# Patient Record
Sex: Female | Born: 1975 | Race: Black or African American | Hispanic: No | Smoking: Never smoker
Health system: Southern US, Community
[De-identification: ages and names within clinical notes are randomized; demographics above are authoritative.]

## PROBLEM LIST (undated history)

## (undated) DIAGNOSIS — I10 Essential (primary) hypertension: Secondary | ICD-10-CM

## (undated) DIAGNOSIS — R0902 Hypoxemia: Secondary | ICD-10-CM

## (undated) DIAGNOSIS — R112 Nausea with vomiting, unspecified: Secondary | ICD-10-CM

## (undated) DIAGNOSIS — G4730 Sleep apnea, unspecified: Secondary | ICD-10-CM

## (undated) DIAGNOSIS — E119 Type 2 diabetes mellitus without complications: Secondary | ICD-10-CM

## (undated) DIAGNOSIS — R011 Cardiac murmur, unspecified: Secondary | ICD-10-CM

## (undated) DIAGNOSIS — Z9889 Other specified postprocedural states: Secondary | ICD-10-CM

## (undated) DIAGNOSIS — K219 Gastro-esophageal reflux disease without esophagitis: Secondary | ICD-10-CM

## (undated) DIAGNOSIS — I214 Non-ST elevation (NSTEMI) myocardial infarction: Secondary | ICD-10-CM

## (undated) DIAGNOSIS — E785 Hyperlipidemia, unspecified: Secondary | ICD-10-CM

## (undated) DIAGNOSIS — G4733 Obstructive sleep apnea (adult) (pediatric): Secondary | ICD-10-CM

## (undated) HISTORY — DX: Hyperlipidemia, unspecified: E78.5

## (undated) HISTORY — DX: Nausea with vomiting, unspecified: R11.2

## (undated) HISTORY — DX: Sleep apnea, unspecified: G47.30

## (undated) HISTORY — DX: Non-ST elevation (NSTEMI) myocardial infarction: I21.4

## (undated) HISTORY — DX: Hypoxemia: R09.02

## (undated) HISTORY — DX: Other specified postprocedural states: Z98.890

## (undated) HISTORY — DX: Cardiac murmur, unspecified: R01.1

## (undated) HISTORY — DX: Type 2 diabetes mellitus without complications: E11.9

## (undated) HISTORY — PX: CHOLECYSTECTOMY: SHX55

## (undated) HISTORY — DX: Gastro-esophageal reflux disease without esophagitis: K21.9

## (undated) HISTORY — PX: SLEEVE GASTROPLASTY: SHX1101

---

## 1996-12-24 HISTORY — PX: LAPAROSCOPY: SHX197

## 1998-02-11 ENCOUNTER — Inpatient Hospital Stay (HOSPITAL_COMMUNITY): Admission: AD | Admit: 1998-02-11 | Discharge: 1998-02-11 | Payer: Self-pay | Admitting: Obstetrics

## 1998-02-13 ENCOUNTER — Inpatient Hospital Stay (HOSPITAL_COMMUNITY): Admission: AD | Admit: 1998-02-13 | Discharge: 1998-02-13 | Payer: Self-pay | Admitting: Obstetrics

## 1998-03-11 ENCOUNTER — Ambulatory Visit (HOSPITAL_COMMUNITY): Admission: RE | Admit: 1998-03-11 | Discharge: 1998-03-11 | Payer: Self-pay | Admitting: Obstetrics

## 1998-03-18 ENCOUNTER — Inpatient Hospital Stay (HOSPITAL_COMMUNITY): Admission: AD | Admit: 1998-03-18 | Discharge: 1998-03-18 | Payer: Self-pay | Admitting: Obstetrics

## 1998-03-22 ENCOUNTER — Inpatient Hospital Stay (HOSPITAL_COMMUNITY): Admission: AD | Admit: 1998-03-22 | Discharge: 1998-03-22 | Payer: Self-pay | Admitting: Obstetrics

## 1998-05-06 ENCOUNTER — Inpatient Hospital Stay (HOSPITAL_COMMUNITY): Admission: AD | Admit: 1998-05-06 | Discharge: 1998-05-06 | Payer: Self-pay | Admitting: Obstetrics and Gynecology

## 1998-06-23 ENCOUNTER — Inpatient Hospital Stay (HOSPITAL_COMMUNITY): Admission: AD | Admit: 1998-06-23 | Discharge: 1998-06-23 | Payer: Self-pay | Admitting: Obstetrics and Gynecology

## 1998-07-01 ENCOUNTER — Inpatient Hospital Stay (HOSPITAL_COMMUNITY): Admission: AD | Admit: 1998-07-01 | Discharge: 1998-07-05 | Payer: Self-pay | Admitting: Obstetrics and Gynecology

## 1998-07-06 ENCOUNTER — Encounter (HOSPITAL_COMMUNITY): Admission: RE | Admit: 1998-07-06 | Discharge: 1998-10-04 | Payer: Self-pay | Admitting: Obstetrics and Gynecology

## 1998-10-20 ENCOUNTER — Encounter (HOSPITAL_COMMUNITY): Admission: RE | Admit: 1998-10-20 | Discharge: 1999-01-18 | Payer: Self-pay | Admitting: *Deleted

## 1999-02-07 ENCOUNTER — Other Ambulatory Visit: Admission: RE | Admit: 1999-02-07 | Discharge: 1999-02-07 | Payer: Self-pay | Admitting: Obstetrics and Gynecology

## 1999-07-15 ENCOUNTER — Emergency Department (HOSPITAL_COMMUNITY): Admission: EM | Admit: 1999-07-15 | Discharge: 1999-07-15 | Payer: Self-pay | Admitting: Emergency Medicine

## 1999-07-15 ENCOUNTER — Encounter: Payer: Self-pay | Admitting: Emergency Medicine

## 2000-01-30 ENCOUNTER — Other Ambulatory Visit: Admission: RE | Admit: 2000-01-30 | Discharge: 2000-01-30 | Payer: Self-pay | Admitting: Obstetrics and Gynecology

## 2000-02-08 ENCOUNTER — Ambulatory Visit (HOSPITAL_COMMUNITY): Admission: RE | Admit: 2000-02-08 | Discharge: 2000-02-08 | Payer: Self-pay | Admitting: Obstetrics and Gynecology

## 2000-02-08 ENCOUNTER — Encounter: Payer: Self-pay | Admitting: Obstetrics and Gynecology

## 2000-03-22 ENCOUNTER — Ambulatory Visit (HOSPITAL_COMMUNITY): Admission: RE | Admit: 2000-03-22 | Discharge: 2000-03-22 | Payer: Self-pay | Admitting: Obstetrics and Gynecology

## 2000-03-22 ENCOUNTER — Encounter: Payer: Self-pay | Admitting: Obstetrics and Gynecology

## 2000-05-14 ENCOUNTER — Inpatient Hospital Stay (HOSPITAL_COMMUNITY): Admission: AD | Admit: 2000-05-14 | Discharge: 2000-05-14 | Payer: Self-pay | Admitting: Obstetrics and Gynecology

## 2000-06-07 ENCOUNTER — Ambulatory Visit (HOSPITAL_COMMUNITY): Admission: RE | Admit: 2000-06-07 | Discharge: 2000-06-07 | Payer: Self-pay | Admitting: Obstetrics and Gynecology

## 2000-08-08 ENCOUNTER — Inpatient Hospital Stay (HOSPITAL_COMMUNITY): Admission: AD | Admit: 2000-08-08 | Discharge: 2000-08-08 | Payer: Self-pay | Admitting: Obstetrics and Gynecology

## 2000-08-17 ENCOUNTER — Inpatient Hospital Stay (HOSPITAL_COMMUNITY): Admission: AD | Admit: 2000-08-17 | Discharge: 2000-08-20 | Payer: Self-pay | Admitting: Obstetrics and Gynecology

## 2000-11-28 ENCOUNTER — Encounter: Payer: Self-pay | Admitting: Emergency Medicine

## 2000-11-28 ENCOUNTER — Emergency Department (HOSPITAL_COMMUNITY): Admission: EM | Admit: 2000-11-28 | Discharge: 2000-11-28 | Payer: Self-pay | Admitting: Emergency Medicine

## 2001-01-28 ENCOUNTER — Other Ambulatory Visit: Admission: RE | Admit: 2001-01-28 | Discharge: 2001-01-28 | Payer: Self-pay | Admitting: Obstetrics and Gynecology

## 2001-05-23 ENCOUNTER — Emergency Department (HOSPITAL_COMMUNITY): Admission: EM | Admit: 2001-05-23 | Discharge: 2001-05-23 | Payer: Self-pay | Admitting: Emergency Medicine

## 2001-11-26 ENCOUNTER — Encounter: Admission: RE | Admit: 2001-11-26 | Discharge: 2001-11-26 | Payer: Self-pay | Admitting: Family Medicine

## 2001-11-26 ENCOUNTER — Encounter: Payer: Self-pay | Admitting: Family Medicine

## 2001-12-15 ENCOUNTER — Encounter: Payer: Self-pay | Admitting: General Surgery

## 2001-12-15 ENCOUNTER — Encounter (INDEPENDENT_AMBULATORY_CARE_PROVIDER_SITE_OTHER): Payer: Self-pay | Admitting: Specialist

## 2001-12-15 ENCOUNTER — Observation Stay (HOSPITAL_COMMUNITY): Admission: RE | Admit: 2001-12-15 | Discharge: 2001-12-16 | Payer: Self-pay | Admitting: General Surgery

## 2002-02-24 ENCOUNTER — Other Ambulatory Visit: Admission: RE | Admit: 2002-02-24 | Discharge: 2002-02-24 | Payer: Self-pay | Admitting: Obstetrics and Gynecology

## 2003-01-11 ENCOUNTER — Emergency Department (HOSPITAL_COMMUNITY): Admission: EM | Admit: 2003-01-11 | Discharge: 2003-01-11 | Payer: Self-pay | Admitting: Emergency Medicine

## 2003-02-05 ENCOUNTER — Ambulatory Visit (HOSPITAL_COMMUNITY): Admission: RE | Admit: 2003-02-05 | Discharge: 2003-02-05 | Payer: Self-pay | Admitting: Obstetrics and Gynecology

## 2003-02-05 ENCOUNTER — Encounter: Payer: Self-pay | Admitting: Obstetrics and Gynecology

## 2003-04-14 ENCOUNTER — Inpatient Hospital Stay (HOSPITAL_COMMUNITY): Admission: AD | Admit: 2003-04-14 | Discharge: 2003-04-14 | Payer: Self-pay | Admitting: Obstetrics and Gynecology

## 2003-06-29 ENCOUNTER — Encounter (INDEPENDENT_AMBULATORY_CARE_PROVIDER_SITE_OTHER): Payer: Self-pay

## 2003-06-29 ENCOUNTER — Inpatient Hospital Stay (HOSPITAL_COMMUNITY): Admission: AD | Admit: 2003-06-29 | Discharge: 2003-07-02 | Payer: Self-pay | Admitting: Obstetrics and Gynecology

## 2003-06-30 ENCOUNTER — Encounter (INDEPENDENT_AMBULATORY_CARE_PROVIDER_SITE_OTHER): Payer: Self-pay | Admitting: Specialist

## 2003-07-03 ENCOUNTER — Encounter: Admission: RE | Admit: 2003-07-03 | Discharge: 2003-08-02 | Payer: Self-pay | Admitting: Obstetrics and Gynecology

## 2003-08-03 ENCOUNTER — Encounter: Admission: RE | Admit: 2003-08-03 | Discharge: 2003-09-02 | Payer: Self-pay | Admitting: Obstetrics and Gynecology

## 2003-09-03 ENCOUNTER — Encounter: Admission: RE | Admit: 2003-09-03 | Discharge: 2003-10-03 | Payer: Self-pay | Admitting: Obstetrics and Gynecology

## 2003-11-03 ENCOUNTER — Encounter: Admission: RE | Admit: 2003-11-03 | Discharge: 2003-12-03 | Payer: Self-pay | Admitting: Obstetrics and Gynecology

## 2004-01-03 ENCOUNTER — Encounter: Admission: RE | Admit: 2004-01-03 | Discharge: 2004-02-02 | Payer: Self-pay | Admitting: Obstetrics and Gynecology

## 2004-02-03 ENCOUNTER — Encounter: Admission: RE | Admit: 2004-02-03 | Discharge: 2004-03-04 | Payer: Self-pay | Admitting: Obstetrics and Gynecology

## 2004-04-02 ENCOUNTER — Encounter: Admission: RE | Admit: 2004-04-02 | Discharge: 2004-05-02 | Payer: Self-pay | Admitting: Obstetrics and Gynecology

## 2004-06-02 ENCOUNTER — Encounter: Admission: RE | Admit: 2004-06-02 | Discharge: 2004-07-02 | Payer: Self-pay | Admitting: Obstetrics and Gynecology

## 2004-08-02 ENCOUNTER — Encounter: Admission: RE | Admit: 2004-08-02 | Discharge: 2004-09-01 | Payer: Self-pay | Admitting: Obstetrics and Gynecology

## 2004-09-02 ENCOUNTER — Encounter: Admission: RE | Admit: 2004-09-02 | Discharge: 2004-10-02 | Payer: Self-pay | Admitting: Obstetrics and Gynecology

## 2004-12-13 ENCOUNTER — Other Ambulatory Visit: Admission: RE | Admit: 2004-12-13 | Discharge: 2004-12-13 | Payer: Self-pay | Admitting: Family Medicine

## 2006-01-29 ENCOUNTER — Other Ambulatory Visit: Admission: RE | Admit: 2006-01-29 | Discharge: 2006-01-29 | Payer: Self-pay | Admitting: Family Medicine

## 2007-03-17 ENCOUNTER — Encounter: Admission: RE | Admit: 2007-03-17 | Discharge: 2007-04-16 | Payer: Self-pay | Admitting: Family Medicine

## 2007-04-21 ENCOUNTER — Other Ambulatory Visit: Admission: RE | Admit: 2007-04-21 | Discharge: 2007-04-21 | Payer: Self-pay | Admitting: Family Medicine

## 2009-01-03 ENCOUNTER — Emergency Department (HOSPITAL_COMMUNITY): Admission: EM | Admit: 2009-01-03 | Discharge: 2009-01-03 | Payer: Self-pay | Admitting: Family Medicine

## 2009-04-11 ENCOUNTER — Emergency Department (HOSPITAL_COMMUNITY): Admission: EM | Admit: 2009-04-11 | Discharge: 2009-04-11 | Payer: Self-pay | Admitting: Family Medicine

## 2009-05-09 ENCOUNTER — Other Ambulatory Visit: Admission: RE | Admit: 2009-05-09 | Discharge: 2009-05-09 | Payer: Self-pay | Admitting: Family Medicine

## 2009-09-11 ENCOUNTER — Emergency Department (HOSPITAL_COMMUNITY): Admission: EM | Admit: 2009-09-11 | Discharge: 2009-09-12 | Payer: Self-pay | Admitting: Emergency Medicine

## 2009-09-12 ENCOUNTER — Ambulatory Visit: Payer: Self-pay | Admitting: Psychiatry

## 2009-09-12 ENCOUNTER — Inpatient Hospital Stay (HOSPITAL_COMMUNITY): Admission: AD | Admit: 2009-09-12 | Discharge: 2009-09-13 | Payer: Self-pay | Admitting: Psychiatry

## 2009-10-13 ENCOUNTER — Emergency Department (HOSPITAL_COMMUNITY): Admission: EM | Admit: 2009-10-13 | Discharge: 2009-10-13 | Payer: Self-pay | Admitting: Emergency Medicine

## 2009-12-20 ENCOUNTER — Emergency Department (HOSPITAL_COMMUNITY): Admission: EM | Admit: 2009-12-20 | Discharge: 2009-12-20 | Payer: Self-pay | Admitting: Family Medicine

## 2011-03-26 LAB — WET PREP, GENITAL
Trich, Wet Prep: NONE SEEN
Yeast Wet Prep HPF POC: NONE SEEN

## 2011-03-26 LAB — GC/CHLAMYDIA PROBE AMP, GENITAL
Chlamydia, DNA Probe: NEGATIVE
GC Probe Amp, Genital: NEGATIVE

## 2011-03-30 LAB — BASIC METABOLIC PANEL
BUN: 11 mg/dL (ref 6–23)
CO2: 24 mEq/L (ref 19–32)
Calcium: 9.3 mg/dL (ref 8.4–10.5)
Chloride: 103 mEq/L (ref 96–112)
Creatinine, Ser: 0.72 mg/dL (ref 0.4–1.2)
GFR calc Af Amer: >60 mL/min (ref >60)
GFR calc non Af Amer: >60 mL/min (ref >60)
Glucose, Bld: 82 mg/dL (ref 70–99)
Potassium: 3.8 mEq/L (ref 3.5–5.1)
Sodium: 135 mEq/L (ref 135–145)

## 2011-03-30 LAB — CBC
HCT: 38.1 % (ref 36.0–46.0)
Hemoglobin: 12.7 g/dL (ref 12.0–15.0)
MCHC: 33.3 g/dL (ref 30.0–36.0)
MCV: 82.7 fL (ref 78.0–100.0)
Platelets: 255 10*3/uL (ref 150–400)
RBC: 4.61 MIL/uL (ref 3.87–5.11)
RDW: 15.5 % (ref 11.5–15.5)
WBC: 6.2 10*3/uL (ref 4.0–10.5)

## 2011-03-30 LAB — DIFFERENTIAL
Basophils Absolute: 0.1 10*3/uL (ref 0.0–0.1)
Basophils Relative: 1 % (ref 0–1)
Eosinophils Absolute: 0.1 10*3/uL (ref 0.0–0.7)
Eosinophils Relative: 2 % (ref 0–5)
Lymphocytes Relative: 40 % (ref 12–46)
Lymphs Abs: 2.5 10*3/uL (ref 0.7–4.0)
Monocytes Absolute: 0.4 10*3/uL (ref 0.1–1.0)
Monocytes Relative: 7 % (ref 3–12)
Neutro Abs: 3 10*3/uL (ref 1.7–7.7)
Neutrophils Relative %: 49 % (ref 43–77)

## 2011-03-30 LAB — ETHANOL: Alcohol, Ethyl (B): <5 mg/dL (ref 0–10)

## 2011-03-30 LAB — RAPID URINE DRUG SCREEN, HOSP PERFORMED
Amphetamines: NOT DETECTED
Barbiturates: NOT DETECTED
Benzodiazepines: POSITIVE — AB
Cocaine: NOT DETECTED
Opiates: NOT DETECTED
Tetrahydrocannabinol: NOT DETECTED

## 2011-03-30 LAB — ACETAMINOPHEN LEVEL: Acetaminophen (Tylenol), Serum: <10 ug/mL — ABNORMAL LOW (ref 10–30)

## 2011-05-11 NOTE — Op Note (Signed)
Lauren Carr, Lauren Carr                        ACCOUNT NO.:  [id number]   MEDICAL RECORD NO.:  [id number]                   PATIENT TYPE:  INP   LOCATION:  9110                                 FACILITY:  [facility]   PHYSICIAN:  Malachi Pro. Ambrose Mantle, M.D.              DATE OF BIRTH:  1976/03/01   DATE OF PROCEDURE:  06/30/2003  DATE OF DISCHARGE:                                 OPERATIVE REPORT   PREOPERATIVE DIAGNOSIS:  Voluntary sterilization.   POSTOPERATIVE DIAGNOSIS:  Voluntary sterilization.   OPERATION:  Bilateral tubal ligation.   SURGEON:  Malachi Pro. Ambrose Mantle, M.D.   ANESTHESIA:  Epidural anesthesia.   DESCRIPTION OF PROCEDURE:  The patient is brought to the operating room and  had been counseled on at least three occasions about the pros and cons of  sterilization.  She wanted to proceed.  She was brought to the operating  room and the epidural anesthetic was boosted.  She was placed on the  operating table.  The abdomen was prepped with Betadine solution and draped  as a sterile field.  The patient had a large keloid in the inferior portion  of the umbilicus secondary to a prior laparoscopic cholecystectomy.  This  keloid was used to enter through it and the incision was carried through the  keloid down into the subcutaneous tissue, through the fascia and down to the  peritoneum.  The peritoneum was entered without difficulty.  There was no  significant scarring although there was one adhesion present that was  divided with the electrical current.  Both tubes were identified.  The right  tube was somewhat difficult to trace to its fimbriated end, but I did trace  it to its fimbriated end.  The ovary looked normal.  The ovary was pulled  out of the abdominal cavity.  I inspected it and it was normal.  The right  tube was quite edematous, but I was able to trace it both to its fimbriated  end and to its junction with the uterus.  I identified an avascular portion  in the mid  segment of the mesosalpinx, made a window in it with the  electrical current, placed two ties of 0 plain catgut proximally and  distally and excised the portion of tube in-between.  I confirmed that there  was a lumen present.  There was no bleeding. I did the same procedure on the  left side. The left tube was more normal in appearance in that it did not  have as much edema present.  The left tube was handled in exactly the same  way.  There was no bleeding. I did need to use a sponge to get the operative  field clear on the left.  The sponge was removed.  The incision was closed  with two interrupted figure-of-eight sutures of 0 Vicryl incorporating the  peritoneum and the fascia in both sutures.  The subcutaneous  tissue was not  closed.  The skin was reapproximated with 3-0 plain catgut.  The patient  seemed to tolerate the procedure well.  Blood loss was less than 5 mL.  The  sponge and needle counts were correct.  She was returned to the recovery  room in satisfactory condition.                                               Malachi Pro. Ambrose Mantle, M.D.    TFH/MEDQ  D:  06/30/2003  T:  06/30/2003  Job:  [id number]

## 2011-05-11 NOTE — Discharge Summary (Signed)
Lauren Carr, Lauren Carr                        ACCOUNT NO.:  [id number]   MEDICAL RECORD NO.:  [id number]                   PATIENT TYPE:  INP   LOCATION:  9110                                 FACILITY:  [facility]   PHYSICIAN:  Malachi Pro. Ambrose Mantle, M.D.              DATE OF BIRTH:  [birth year]   DATE OF ADMISSION:  06/29/2003  DATE OF DISCHARGE:  07/02/2003                                 DISCHARGE SUMMARY   HISTORY OF PRESENT ILLNESS:  A 35 year old black female para 2-0-0-2,  gravida 3 admitted at 38+ weeks gestation with regular contractions.  Blood  group and type O+.  Negative antibody.  RPR nonreactive.  Rubella immune.  Hepatitis B surface antigen negative.  HIV negative.  GC and Chlamydia  negative.  Triple screen normal.  One hour Glucola 96.  Group B Strep  positive.   PAST MEDICAL HISTORY:  As outlined in the present illness.   HOSPITAL COURSE:  She was admitted and placed on penicillin for group B  Strep prophylaxis.  She received an epidural.  Was begun on Pitocin.  Reached full dilatation and delivered a living female infant 7 pounds 4  ounces, Apgars of 8 at one and 9 at five minutes by Malachi Pro. Ambrose Mantle, M.D.  The cesarean section scar was intact.  Postpartum the patient requested  tubal ligation.  She underwent a tubal ligation by Malachi Pro. Ambrose Mantle, M.D.  under the same epidural anesthetic on June 30, 2003.  Postoperatively she did  okay but she was concerned about her bleeding and on the day of July 01, 2003  she was being prepared for discharge, but complained of lightheadedness,  nausea, feeling weak, and complaining that her heart would race when she  would get up.  A CBC was done that showed normal findings.  Zenaida Niece, M.D. saw her at 5:50 p.m. on July 01, 2003.  She still had the  same symptoms so she was kept overnight.  During the last 12 hours she is  markedly improved.  She thinks the symptoms may have been related to  Percocet.  At the present time she is  afebrile.  Blood pressure is normal.  She is tolerating activity better.  She is tolerating a diet and ready for  discharge.  Hemoglobin on admission 11.3, hematocrit 34.9, white count 7200,  platelet count 217,000.  Follow-up hemoglobin 10.2 and 10.4.  RPR  nonreactive.   FINAL DIAGNOSES:  1. Intrauterine pregnancy at 38+ weeks with vaginal birth after cesarean.  2. Nausea, lightheadedness, and palpitations thought to be possibly     secondary to Percocet.   OPERATION:  1. Spontaneous vaginal delivery.  2. Bilateral tubal ligation.   FINAL CONDITION:  Improved.   DISCHARGE INSTRUCTIONS:  Regular discharge instruction booklet.  The patient  is doing fine with Tylenol extra strength so she is not given a prescription  pain medicine.  She is  asked to return to the office in 10 days for follow-  up examination or to report any unusual symptoms.                                               Malachi Pro. Ambrose Mantle, M.D.    TFH/MEDQ  D:  07/02/2003  T:  07/02/2003  Job:  [id number]

## 2011-05-11 NOTE — Op Note (Signed)
[facility]  Patient:    Lauren Carr, Lauren Carr Visit Number: [id number] MRN: [id number]          Service Type: SUR Location: 3W 0340 01 Attending Physician:  Brandy Hale Dictated by:   Angelia Mould. Derrell Lolling, M.D. Proc. Date: 12/15/01 Admit Date:  12/15/2001   CC:         Doreatha Lew, M.D.   Operative Report  PREOPERATIVE DIAGNOSIS:  Chronic cholecystitis with cholelithiasis.  POSTOPERATIVE DIAGNOSIS:  Chronic cholecystitis with cholelithiasis.  OPERATION:  Laparoscopic cholecystectomy with intraoperative cholangiogram.  SURGEON:  Angelia Mould. Derrell Lolling, M.D.  ASSISTANT:  Sheppard Plumber. Earlene Plater, M.D.  OPERATIVE INDICATIONS:  This is a 35 year old black female who has an 71-month history of intermittent episodes of epigastric pain, nausea, and vomiting which typically occurs at night two to four hours after supper. She has tried various diets and proton pump inhibitors without relief. Gallbladder ultrasound shows multiple shadowing gallstones. Common bile duct measures 4 mm. Liver function tests show SGOT of 140 and SGPT of 218. Each about 5 to 6 times normal. The rest of her liver function tests are normal. She is brought to the operating room electively.  OPERATIVE FINDINGS:  The gallbladder was chronically inflamed, discolored, had extensive adhesions to it. The anatomy of the cystic duct, cystic artery, and common bile duct were conventional. The liver, stomach, duodenum, large and small intestine, and peritoneal surfaces were otherwise normal. The intraoperative cholangiogram was normal, showing normal intrahepatic and extrahepatic bile ducts, no filling defect, and prompt flow of contrast in to the duodenum.  OPERATIVE TECHNIQUE:  Upon the induction of general endotracheal anesthesia, the patients abdomen was prepped and draped in the sterile fashion. Marcaine 0.5% with epinephrine was used as a local infiltration anesthetic. A transverse  incision was made at the lower rim of the umbilicus, through a previous laparoscopy scar. The fascia was incised in the midline and the abdominal cavity entered under direct vision. A 10 mm Hasson trocar was inserted and secured with a pursestring suture of 0 Vicryl. Pneumoperitoneum was created. Video camera was inserted with visualization and findings as described above. Under direct vision, we inserted a 10 mm trocar in the subxiphoid region and two 5 mm trocars in the right mid abdomen. The gallbladder fundus was elevated. We spent a little bit of time taking down fairly extensive adhesions but these were soft and chronic. We were able to dissect out the cystic duct and cystic artery with good visualization. The cystic artery was intimately associated and twisted around the cystic duct but after some dissection we isolated this and identified it as it went on the gallbladder wall. The cystic artery was then secured with metal clips and divided. We were then able to nicely isolate the cystic duct and create a nice window behind the cystic duct and the gallbladder. The cystic duct was secured with a metal clip close to the gallbladder. Cholangiogram catheter was inserted into the cystic duct and a cholangiogram was obtained using the C-arm. This showed normal intrahepatic and normal extrahepatic bile ducts, prompt flow of contrast into the duodenum, and no filling defects. The cholangiogram catheter was then removed and the cystic duct secured with metal clips and divided. The gallbladder was dissected from its bed with electrocautery and removed through the umbilical port. The operative field was copiously irrigated with saline. At the completion of the case, there was no bleeding and no bile leak whatsoever. The irrigation fluid was clear. The trocars were removed  under direct vision and there was no bleeding from the trocar sites. The pneumoperitoneum was released. The fascia and the  umbilicus was closed with 0 Vicryl sutures. Skin incisions were closed with subcuticular sutures of 4-0 Vicryl and Steri-Strips. Clean bandages were placed and the patient was taken to the recovery room in stable condition. Estimated blood loss was 20 cc, complications none, and sponge, needle, and instrument counts were correct. Dictated by:   Angelia Mould. Derrell Lolling, M.D. Attending Physician:  Brandy Hale DD:  12/15/01 TD:  12/15/01 Job: [id number]

## 2011-05-11 NOTE — Discharge Summary (Signed)
[facility]  Patient:    Lauren Carr, Lauren Carr                      MRN: [id number] Adm. Date:  [id number] Disc. Date: 08/20/00 Attending:  Malon Kindle                           Discharge Summary  HISTORY OF PRESENT ILLNESS:   [age]-year-old black married female, para 1-0-0-1, gravida 2, last period November 10, 1999, [facility] August 17, 2000, by ultrasound.  Admitted with premature rupture of membranes.  Blood group and type O positive with a negative antibody, sickle cell negative.  RPR nonreactive.  Rubella positive.  Hepatitis B surface antigen negative.  HIV negative.  TSH was 0.01.  It rose to 0.47.  GC and chlamydia negative.  Triple screen normal.  Three-hour Glucola test 90, 177, 145, and 122.  Group B strep negative.  Vaginal ultrasound January 12, 2000:  Crown-rump length 2.1 cm, 8 weeks 6 days, [facility] August 17, 2000.  Because of the TSH being markedly suppressed, ultrasound was done of the thyroid.  It showed no thyroid nodules, a slight enlargement.  On March 06, 2000, TSH was 0.47.  She was treated with Chromagen for anemia.  On July 01, 2000, the patient complained of contractions.  Fetal fibronectin was negative.  She began Valtrex on July 19, 2000, because a history of herpes.  The patient desired a vaginal birth after cesarean and stated she began leaking fluid on August 16, 2000.  She thought it was urine.  At approximately 3 a.m. on August 17, 2000, the patient had definite leakage of fluid and came to the maternity admissions unit at 5 a.m. Crist Fat was positive, contractions remained irregular.  PAST MEDICAL HISTORY:         No known allergies.  Operations:  In 1998, laparoscopy; in 1999, C-section.  The patient had active herpes at that time and did not have labor.  Illnesses:  Chlamydia in 1991.  Herpes.  Injuries: Broken right ankle in 1996, left ankle in 1998.  FAMILY HISTORY:               Mother with heart disease and high  blood pressure.  Grandparents with high blood pressure and diabetes.  ALCOHOL, TOBACCO, DRUGS:      None.  PHYSICAL EXAMINATION:  VITAL SIGNS:                  Normal.  ABDOMEN:                      Soft.  Fundal height 37 cm.  On August 01, 2000, fetal heart tones were normal.  There were irregular contractions.  The cervix was fingertip, 60%, vertex, at a -2 to -3, and fern was positive.  IMPRESSION:                   1. Intrauterine pregnancy at 40 weeks.                               2. Premature rupture of membranes.                               3. Prior cesarean section.  4. Desired vaginal birth after cesarean section.  HOSPITAL COURSE:  At 1:20 p.m., Pitocin was begun.  At 5:15 p.m., she reached 4 cm, requested an epidural.  At 9:30 p.m., a note was made that at 15 mU/min Pitocin contractions were not effective.  The Pitocin was dropped back to 8 mU a minute, and the contractions became every three minutes.  In spite of history of rupture of membranes, the patient did have a forebag of water. Rupture of membranes produced possibly meconium-stained fluid.  The cervix was 5 cm.  She was begun on Unasyn.  The patient reached full dilatation at 3:30 a.m.  She had a temperature of 101+.  She brought the vertex to the perineum and stalled.  Because of meconium-stained fluid with the NICU staff present, elevated maternal temperatures, and variable decelerations, and patient request, a low forceps delivery was done by Dr. Parke Simmers, over a second-degree midline laceration of a living female infant, 7 pounds 9 ounces, Apgars of 8 at one and 9 at five minutes.  Dr. Francine Graven inspected the cords, and there was no meconium.  I suctioned the nose and pharynx with DeLee prior to delivery of the body.  There was no meconium seen.  Placenta was intact, uterus normal.  The scar from the previous C-section was intact.  Rectum was negative.  Second-degree midline  laceration repaired with 3-0 Dexon.  Blood loss about 400 cc.  Postpartum the patient did quite well and was discharged on the second postpartum day.  Initial hemoglobin was 10.9, hematocrit 32.1, white count 6400, platelet count 213,000.  Follow-up hemoglobin 9.2, hematocrit 27.6, white count 11,400, platelet count 195,000.  RPR was nonreactive.  FINAL DIAGNOSES:              1. Intrauterine pregnancy at 40 weeks, delivered                                  left occiput anterior.                               2. Premature rupture of membranes.                               3. Previous cesarean section.                               4. Vaginal birth after cesarean section.  OPERATIONS:                   1. Low forceps delivery LOA.                               2. Second-degree midline laceration and repair.  FINAL CONDITION:              Improved.  DISCHARGE INSTRUCTIONS:       Regular discharge instruction booklet.  FOLLOW-UP:                    Return to the office in six weeks for follow-up examination. DD:  08/20/00 TD:  08/20/00 Job: [id number]

## 2011-05-11 NOTE — H&P (Signed)
Lauren Carr, Lauren Carr                        ACCOUNT NO.:  [id number]   MEDICAL RECORD NO.:  [id number]                   PATIENT TYPE:  INP   LOCATION:  9110                                 FACILITY:  [facility]   PHYSICIAN:  Malachi Pro. Ambrose Mantle, M.D.              DATE OF BIRTH:  [birth year]   DATE OF ADMISSION:  06/29/2003  DATE OF DISCHARGE:                                HISTORY & PHYSICAL   REASON FOR ADMISSION:  This is a 35 year old black female, para 2-0-0-2,  gravida 3.  Estimated gestational age [redacted] weeks and 4 days by last menstrual  period, compatible with a 9 week ultrasound, with EDC of July 09, 2003, who  presented complaining of regular contractions.  Her blood group and type was  O positive with a negative antibody.  RPR nonreactive.  Rubella immune.  Hepatitis B surface antigen negative.  HIV negative.  GC and Chlamydia  negative.  Triple screen normal.  One hour Glucola 96.  Group B strep was  positive.  During her prenatal care, condylomata were treated with TCA on  three occasions.  She had a history of herpes simplex virus.  She was on  Valtrex suppression.  Recent sinusitis was treated with Augmentin.   OBSTETRIC HISTORY:  In 1999, she had a low transverse cervical C-section at  39 weeks, 7 pound 1 ounce infant.  In 2001, she had a VBAC at 40 weeks, 7  pound 9 ounce infant with premature rupture of the membranes.   GYNECOLOGIC HISTORY:  Herpes simplex virus and history of Chlamydia.   PAST MEDICAL HISTORY:  1. History of goiter.  2. Fracture of the right and left ankles.   SURGICAL HISTORY:  She had a laparoscopic procedure, C-section, and  cholecystectomy.   ALLERGIES:  She had no known drug allergies.   MEDICATIONS:  Valtrex 900 mg everyday.   SOCIAL HISTORY:  No tobacco.  Father of the baby was present.   PHYSICAL EXAMINATION:  VITAL SIGNS:  On admission, her vital signs were  normal.  Fetal heart tones were reassuring.  Contractions every 3 to 5   minutes.  ABDOMEN:  The abdomen was gravid and nontender.   DELIVERY COURSE:  Her cervix was 3 to 4 cm dilated.  The patient progressed  to 4 cm.  She received an epidural at approximately 10:30 a.m.  Her  contractions spaced out.  I was called to evaluate the strip at  approximately 12:15 p.m.  The strip was reviewed and was basically normal.  Pitocin was used to augment contractions.  She reached full dilatation,  pushed well, and delivered spontaneously with intact perineum by Dr. Ambrose Mantle,  a living female infant, 7 pounds 4 ounces, Apgars of 8 at one and 9 at five  minutes.  Placenta was intact.  Uterus and the C-section scar were normal.  There were no lacerations.  Blood loss about 500 cc.  Postpartum,  the  patient reiterated her desire for tubal ligation.  I counseled her about the  pros and cons of tubal ligation and she wants to proceed.    ADMITTING IMPRESSION AND PLAN:  Intrauterine pregnancy at 38+ weeks,  delivered by vaginal birth after cesarean.  Desire for voluntary  sterilization.  The patient is prepared for bilateral tubal ligation.                                               Malachi Pro. Ambrose Mantle, M.D.    TFH/MEDQ  D:  06/30/2003  T:  06/30/2003  Job:  [id number]

## 2014-06-28 ENCOUNTER — Other Ambulatory Visit: Payer: Self-pay | Admitting: Family

## 2014-06-28 ENCOUNTER — Ambulatory Visit
Admission: RE | Admit: 2014-06-28 | Discharge: 2014-06-28 | Disposition: A | Discharge status: Home / Self Care | Payer: 59 | Source: Ambulatory Visit | Attending: Family | Admitting: Family

## 2014-06-28 DIAGNOSIS — M79672 Pain in left foot: Secondary | ICD-10-CM

## 2015-04-11 ENCOUNTER — Emergency Department (INDEPENDENT_AMBULATORY_CARE_PROVIDER_SITE_OTHER)
Admission: EM | Admit: 2015-04-11 | Discharge: 2015-04-11 | Disposition: A | Discharge status: Home / Self Care | Payer: 59 | Source: Home / Self Care | Attending: Family Medicine | Admitting: Family Medicine

## 2015-04-11 ENCOUNTER — Encounter (HOSPITAL_COMMUNITY): Payer: Self-pay | Admitting: *Deleted

## 2015-04-11 ENCOUNTER — Emergency Department (INDEPENDENT_AMBULATORY_CARE_PROVIDER_SITE_OTHER): Payer: 59

## 2015-04-11 DIAGNOSIS — R0789 Other chest pain: Secondary | ICD-10-CM | POA: Diagnosis not present

## 2015-04-11 MED ORDER — DICLOFENAC POTASSIUM 50 MG PO TABS: 50.0000 mg | ORAL_TABLET | Freq: Three times a day (TID) | ORAL | Status: DC

## 2015-04-11 NOTE — ED Notes (Signed)
Pt  Reports       Chest  Pain        X  sev   Weeks      -     Pt reports   Was  Placed     On   Beta     Blocker             Pt  Reports  Pain  Is   Worse   When   She  Moves      Or takes  A  Deep  Breath             Pt     Reports    Pain   Is    Dull     /  Pressure   Pain  That  Is   Squeezing  In  Nature           Pain is    midsternal  And  Is  High  In  Her  Chest        Her  Skin is  Warm  And  Dry       Pain  On palpation

## 2015-04-11 NOTE — ED Provider Notes (Signed)
CSN: [id number]     Arrival date & time 04/11/15  1554 History   First MD Initiated Contact with Patient 04/11/15 1631     Chief Complaint  Patient presents with  . Chest Pain   (Consider location/radiation/quality/duration/timing/severity/associated sxs/prior Treatment) Patient is a 39 y.o. female presenting with chest pain. The history is provided by the patient.  Chest Pain Pain location:  L lateral chest Pain quality: dull and sharp   Pain radiates to:  Upper back Pain radiates to the back: yes   Pain severity:  Moderate Onset quality:  Gradual Duration:  3 weeks Progression:  Worsening Chronicity:  New Context: movement   Relieved by:  None tried Associated symptoms: back pain   Associated symptoms: no abdominal pain, no cough, no dizziness, no dysphagia, no fever, no lower extremity edema, no nausea, no palpitations, no shortness of breath and not vomiting   Risk factors: no high cholesterol and no smoking     History reviewed. No pertinent past medical history. Past Surgical History  Procedure Laterality Date  . Cholecystectomy    . Cesarean section     History reviewed. No pertinent family history. History  Substance Use Topics  . Smoking status: Never Smoker   . Smokeless tobacco: Not on file  . Alcohol Use: Yes   OB History    No data available     Review of Systems  Constitutional: Negative.  Negative for fever.  HENT: Negative.  Negative for trouble swallowing.   Respiratory: Negative for cough, chest tightness and shortness of breath.   Cardiovascular: Positive for chest pain. Negative for palpitations.  Gastrointestinal: Negative for nausea, vomiting and abdominal pain.  Musculoskeletal: Positive for back pain.  Neurological: Negative for dizziness.    Allergies  Review of patient's allergies indicates no known allergies.  Home Medications   Prior to Admission medications   Medication Sig Start Date End Date Taking? Authorizing Provider    Cetirizine HCl (ZYRTEC PO) Take by mouth.   Yes Historical Provider, MD  metoprolol tartrate (LOPRESSOR) 25 MG tablet Take 25 mg by mouth 2 (two) times daily.   Yes Historical Provider, MD  diclofenac (CATAFLAM) 50 MG tablet Take 1 tablet (50 mg total) by mouth 3 (three) times daily. For chest pain 04/11/15   Billy Fischer, MD   BP 137/85 mmHg  Pulse 90  Temp(Src) 98.6 F (37 C) (Oral)  Resp 16  SpO2 98%  LMP 04/05/2015 Physical Exam  Constitutional: She is oriented to person, place, and time. She appears well-developed and well-nourished. No distress.  HENT:  Mouth/Throat: Oropharynx is clear and moist.  Neck: Normal range of motion. Neck supple.  Cardiovascular: Normal rate, regular rhythm and normal heart sounds.   Pulmonary/Chest: Effort normal and breath sounds normal. She has no decreased breath sounds. She has no wheezes. She has no rhonchi. She has no rales.   She exhibits tenderness.    Abdominal: Soft. Bowel sounds are normal.  Neurological: She is alert and oriented to person, place, and time.  Skin: Skin is warm and dry.  Nursing note and vitals reviewed.   ED Course  Procedures (including critical care time) Labs Review Labs Reviewed - No data to display  Imaging Review No results found.   MDM   1. Left-sided chest wall pain        Billy Fischer, MD 04/11/15 [phone]

## 2015-04-11 NOTE — Discharge Instructions (Signed)
Use heat and medicine as needed, see your doctor if further problems.

## 2016-06-08 ENCOUNTER — Other Ambulatory Visit: Payer: Self-pay | Admitting: Nurse Practitioner

## 2016-06-08 DIAGNOSIS — E049 Nontoxic goiter, unspecified: Secondary | ICD-10-CM

## 2016-06-11 ENCOUNTER — Ambulatory Visit
Admission: RE | Admit: 2016-06-11 | Discharge: 2016-06-11 | Disposition: A | Discharge status: Home / Self Care | Payer: Commercial Managed Care - HMO | Source: Ambulatory Visit | Attending: Internal Medicine | Admitting: Internal Medicine

## 2016-06-11 DIAGNOSIS — E049 Nontoxic goiter, unspecified: Secondary | ICD-10-CM

## 2016-12-26 DIAGNOSIS — Z9114 Patient's other noncompliance with medication regimen: Secondary | ICD-10-CM | POA: Diagnosis not present

## 2017-01-25 DIAGNOSIS — Z760 Encounter for issue of repeat prescription: Secondary | ICD-10-CM | POA: Diagnosis not present

## 2017-03-11 DIAGNOSIS — E559 Vitamin D deficiency, unspecified: Secondary | ICD-10-CM | POA: Diagnosis not present

## 2017-03-11 DIAGNOSIS — I10 Essential (primary) hypertension: Secondary | ICD-10-CM | POA: Diagnosis not present

## 2017-03-11 DIAGNOSIS — R7309 Other abnormal glucose: Secondary | ICD-10-CM | POA: Diagnosis not present

## 2017-07-17 DIAGNOSIS — Z1231 Encounter for screening mammogram for malignant neoplasm of breast: Secondary | ICD-10-CM | POA: Diagnosis not present

## 2017-07-18 DIAGNOSIS — E559 Vitamin D deficiency, unspecified: Secondary | ICD-10-CM | POA: Diagnosis not present

## 2017-07-18 DIAGNOSIS — R5383 Other fatigue: Secondary | ICD-10-CM | POA: Diagnosis not present

## 2017-07-18 DIAGNOSIS — Z0000 Encounter for general adult medical examination without abnormal findings: Secondary | ICD-10-CM | POA: Diagnosis not present

## 2017-07-18 DIAGNOSIS — Z1231 Encounter for screening mammogram for malignant neoplasm of breast: Secondary | ICD-10-CM | POA: Diagnosis not present

## 2017-08-02 DIAGNOSIS — Z760 Encounter for issue of repeat prescription: Secondary | ICD-10-CM | POA: Diagnosis not present

## 2017-08-03 DIAGNOSIS — Z0100 Encounter for examination of eyes and vision without abnormal findings: Secondary | ICD-10-CM | POA: Diagnosis not present

## 2017-08-23 ENCOUNTER — Encounter: Payer: Self-pay | Admitting: Neurology

## 2017-08-27 ENCOUNTER — Encounter: Payer: Self-pay | Admitting: Neurology

## 2017-08-27 ENCOUNTER — Ambulatory Visit (INDEPENDENT_AMBULATORY_CARE_PROVIDER_SITE_OTHER): Payer: 59 | Admitting: Neurology

## 2017-08-27 VITALS — BP 126/71 | HR 83 | Ht 69.0 in | Wt 240.0 lb

## 2017-08-27 DIAGNOSIS — F5102 Adjustment insomnia: Secondary | ICD-10-CM | POA: Insufficient documentation

## 2017-08-27 DIAGNOSIS — R0683 Snoring: Secondary | ICD-10-CM | POA: Insufficient documentation

## 2017-08-27 DIAGNOSIS — F5109 Other insomnia not due to a substance or known physiological condition: Secondary | ICD-10-CM

## 2017-08-27 DIAGNOSIS — R61 Generalized hyperhidrosis: Secondary | ICD-10-CM | POA: Diagnosis not present

## 2017-08-27 DIAGNOSIS — R002 Palpitations: Secondary | ICD-10-CM | POA: Insufficient documentation

## 2017-08-27 DIAGNOSIS — F519 Sleep disorder not due to a substance or known physiological condition, unspecified: Secondary | ICD-10-CM | POA: Insufficient documentation

## 2017-08-27 MED ORDER — SERTRALINE HCL 25 MG PO TABS: 25.0000 mg | ORAL_TABLET | Freq: Every day | ORAL | 5 refills | Status: DC

## 2017-08-27 NOTE — Progress Notes (Signed)
SLEEP MEDICINE CLINIC   Provider:  Larey Seat, M D  Primary Care Physician:  [name]   Referring Provider: [name], FNP    Chief Complaint  Patient presents with  . New Patient (Initial Visit)    pt alone, room 10. pt hasnt had a good sleep. pt finds difficulty to fall asleep and stay asleep. pt averages 2-3 hours of sound sleep and if she takes a melatonin then maybe 4 hrs. kids have told her she snores and pt says that she has woke up from sleep gasping for air.     HPI:  Lauren Carr is a 41 year old african [place]  female patient  , seen here as in a referral/ revisit  from Dr. Baird Cancer and NP Laurance Flatten for a sleep consultation,    Chief complaint according to patient : [name] is troubled by her difficulties to go to sleep and to stay asleep, and her children have mentioned that she snores. She has woken herself up from holding her breath, gasping sometimes choking. Snoring may have started as long ago as 14 years. She is excessively daytime sleepy and fatigued-    Sleep habits are as follows: The patient usually watches TV for the last hour before she retreats to her bedroom. This is around 10 PM. Her bedroom is cool, quiet and dark, conducive to sleep. She struggles to fall asleep on many days and has started taking melatonin. She only stays asleep for about 4 hours. Often she wakes up around 2 or 3 AM and cannot return to sleep. The sleep that may follow. Early morning arousals is not as deep and sound and more fragmented. The patient has reported having vivid dreams at night to sit dreams with or without melatonin. She does not have bathroom breaks at night. She usually sleeps on her side with one pillow for head support. She rises in the morning for work at [employer] AM. She is often awake long before her alarm rings. Average  Night time sleep duration is 2-4 hours only.   Sleep medical history and family sleep history:  The patient carries a diagnosis of  vitamin D deficiency, hypertension, high cholesterol, fibromyalgia, and pre-diabetes. She is status post pelvic laparoscopy, cesarean section in 1999, gallbladder removal in 2002. Her family history is positive for a prolonged QT syndrome in her mother who also had breast cancer, her father has suffered from prostate cancer, she has one son with autism, maternal grandparents had coronary artery disease in maternal grandmother also had diabetes. The patient does not recall sleep walking or night terrors in childhood, but during her middle school years became more sleepy.  Social history:  Divorced - 78 year old son  ,75 and 22 year old daughters. Aspergers in her son.  Seldomly drinks alcohol, non tobacco use, caffeine : 2 cups a day , no soda and no iced tea.   Review of Systems: Out of a complete 14 system review, the patient complains of only the following symptoms, and all other reviewed systems are negative. Snoring, insomnia, daytime sleepiness and fatigue.   Epworth score 13, Fatigue severity score 55  , depression score 2/15    Social History   Social History  . Marital status: Divorced    Spouse name: N/A  . Number of children: N/A  . Years of education: N/A   Occupational History  . Not on file.   Social History Main Topics  . Smoking status: Never Smoker  .  Smokeless tobacco: Never Used  . Alcohol use 1.2 oz/week    2 Glasses of wine per week  . Drug use: No  . Sexual activity: Not on file   Other Topics Concern  . Not on file   Social History Narrative  . No narrative on file    Family History  Problem Relation Age of Onset  . Breast cancer Mother   . CAD Mother   . Hypertension Mother   . Cancer - Prostate Father   . Cancer - Cervical Maternal Grandmother   . Diabetes Maternal Grandmother     No past medical history on file.  Past Surgical History:  Procedure Laterality Date  . CESAREAN SECTION    . CHOLECYSTECTOMY      Current Outpatient  Prescriptions  Medication Sig Dispense Refill  . CONTRAVE 8-90 MG TB12     . hydrochlorothiazide (HYDRODIURIL) 12.5 MG tablet     . valsartan (DIOVAN) 80 MG tablet      No current facility-administered medications for this visit.     Allergies as of 08/27/2017  . (No Known Allergies)    Vitals: BP 126/71   Pulse 83   Ht 5\' 9"  (1.753 m)   Wt 240 lb (108.9 kg)   BMI 35.44 kg/m  Last Weight:  Wt Readings from Last 1 Encounters:  08/27/17 240 lb (108.9 kg)   VOZ:DGUY mass index is 35.44 kg/m.     Last Height:   Ht Readings from Last 1 Encounters:  08/27/17 5\' 9"  (1.753 m)    Physical exam:  General: The patient is awake, alert and appears not in acute distress. The patient is well groomed. Head: Normocephalic, atraumatic. Neck is supple. Mallampati 3,  neck circumference: 17 . Nasal airflow patent , Retrognathia is seen.  Cardiovascular:  Regular rate and rhythm, without  murmurs or carotid bruit, and without distended neck veins. Respiratory: Lungs are clear to auscultation. Skin:  Without evidence of edema, or rash Trunk: BMI is 35. The patient's posture is erect  Neurologic exam : The patient is awake and alert, oriented to place and time.   Memory subjective described as intact.  Attention span & concentration ability appears normal.  Speech is fluent,  without dysarthria, dysphonia or aphasia.  Mood and affect are appropriate.  Cranial nerves: Pupils are equal and briskly reactive to light. Funduscopic exam without evidence of pallor or edema.  Extraocular movements  in vertical and horizontal planes intact and without nystagmus. Visual fields by finger perimetry are intact. Hearing to finger rub intact. Facial sensation intact to fine touch. Facial motor strength is symmetric and tongue and uvula move midline. Shoulder shrug was symmetrical.   Motor exam:  Normal tone, muscle bulk and symmetric strength in all extremities. Sensory:  Fine touch, pinprick and  vibration were tested in all extremities. Proprioception tested in the upper extremities was normal. Coordination: Rapid alternating movements in the fingers/hands was normal. Finger-to-nose maneuver  normal without evidence of ataxia, dysmetria or tremor. Gait and station: Patient walks without assistive device . Strength within normal limits.  Stance is stable and normal. Turns with 3 Steps. Romberg testing is  negative.  Deep tendon reflexes: in the upper and lower extremities are symmetric and intact. Babinski maneuver response is downgoing.   Dr. Baird Cancer office also provided recent laboratory test results, the patient's last cholesterol levels were elevated and the total cholesterol was 210, her ALT was 39, glucose level in the morning was 101, triglycerides were elevated  at 182. Vitamin D remained in the low range at 21.8. Vitamin B12 was in normal range, the date of the test was 07/18/2017.  Assessment:  After physical and neurologic examination, review of laboratory studies,  Personal review of imaging studies, reports of other /same  Imaging studies, results of polysomnography and / or neurophysiology testing and pre-existing records as far as provided in visit., my assessment is   1)  Prone to night time sweats- she still has regular periods. She reports difficulties to go to sleep, not because of discomfort but not finding a relaxed state of mind. She wakes up early and is not sure what woke her. Spontaneous early morning arousals can be related to serotonin deficiency, can be related to depression. Sometimes this is situational and transient.  2)  Her son tried to commit suicide, 5 times in 2 years about 3 years ago  and this was the time when her sleep pattern derailed. I would agree to use an antidepressant for sleep aid. An ssri will help with hot flushes, too.   3) She has been snoring- check and rule out apnea.   The patient was advised of the nature of the diagnosed disorder , the  treatment options and the  risks for general health and wellness arising from not treating the condition.   I spent more than 45 minutes of face to face time with the patient.  Greater than 50% of time was spent in counseling and coordination of care. We have discussed the diagnosis and differential and I answered the patient's questions.    Plan:  Treatment plan and additional workup :  I would like for the patient to undergo an attended sleep study for evaluation of diaphoresis, palpitations and possible apnea. In addition I would like to start her on a low-dose of Zoloft 25 mg to be taken in the morning as it may help to shorten her sleep latency and hopefully allows her to sleep longer than 2 AM. My goal would be to permit a sleep duration of 6 hours to allow her to function socially and professionally. I would also recommend a book about sleep," Why We Sleep" by Chiquita Loth PhD.    Larey Seat, MD 06/25/4192, 79:02 AM  Certified in Neurology by ABPN Certified in Sleep Medicine by [facility] [address] [place], [place] [address]

## 2017-08-27 NOTE — Patient Instructions (Signed)

## 2017-11-01 ENCOUNTER — Ambulatory Visit (INDEPENDENT_AMBULATORY_CARE_PROVIDER_SITE_OTHER): Payer: 59 | Admitting: Neurology

## 2017-11-01 DIAGNOSIS — G4730 Sleep apnea, unspecified: Secondary | ICD-10-CM | POA: Diagnosis not present

## 2017-11-01 DIAGNOSIS — R002 Palpitations: Secondary | ICD-10-CM

## 2017-11-01 DIAGNOSIS — F5109 Other insomnia not due to a substance or known physiological condition: Secondary | ICD-10-CM

## 2017-11-01 DIAGNOSIS — R61 Generalized hyperhidrosis: Secondary | ICD-10-CM

## 2017-11-01 DIAGNOSIS — F5102 Adjustment insomnia: Secondary | ICD-10-CM

## 2017-11-01 DIAGNOSIS — R0683 Snoring: Secondary | ICD-10-CM

## 2017-11-01 DIAGNOSIS — F519 Sleep disorder not due to a substance or known physiological condition, unspecified: Secondary | ICD-10-CM

## 2017-11-07 ENCOUNTER — Other Ambulatory Visit: Payer: Self-pay | Admitting: Neurology

## 2017-11-07 DIAGNOSIS — R5383 Other fatigue: Principal | ICD-10-CM

## 2017-11-07 DIAGNOSIS — G479 Sleep disorder, unspecified: Secondary | ICD-10-CM

## 2017-11-07 DIAGNOSIS — F519 Sleep disorder not due to a substance or known physiological condition, unspecified: Secondary | ICD-10-CM

## 2017-11-07 DIAGNOSIS — F5102 Adjustment insomnia: Secondary | ICD-10-CM

## 2017-11-07 DIAGNOSIS — R0683 Snoring: Secondary | ICD-10-CM

## 2017-11-07 DIAGNOSIS — G4733 Obstructive sleep apnea (adult) (pediatric): Secondary | ICD-10-CM

## 2017-11-07 DIAGNOSIS — E669 Obesity, unspecified: Secondary | ICD-10-CM

## 2017-11-07 DIAGNOSIS — R002 Palpitations: Secondary | ICD-10-CM

## 2017-11-07 DIAGNOSIS — R61 Generalized hyperhidrosis: Secondary | ICD-10-CM

## 2017-11-07 DIAGNOSIS — F5109 Other insomnia not due to a substance or known physiological condition: Secondary | ICD-10-CM

## 2017-11-07 NOTE — Procedures (Signed)
PATIENT'S NAME:  Lauren Carr, Lauren Carr DOB:      [birth year]      MR#:    [id number]     DATE OF RECORDING: 11/01/2017 REFERRING M.D.:  [name], FNP Study Performed:   Baseline Polysomnogram HISTORY:   Mr. Choate is troubled by her difficulties to go to sleep and to stay asleep, and her children have mentioned that she snores. She has woken herself up from holding her breath, gasping sometimes choking. Snoring may have started as long ago as 16 years. She is excessively daytime sleepy and fatigued-Insomnia, snoring, heart palpitations, sleep choking, and morbid obesity. The patient endorsed the Epworth Sleepiness Scale at 13/24 points.  The patient weighs 240 pounds at height of 69 (inches), resulting in a BMI of 35.6 kg/m2.The patient's neck circumference measured 17 inches.  CURRENT MEDICATIONS: Diovan, Hydrochlorothiazide, Contrave   PROCEDURE:  This is a multichannel digital polysomnogram utilizing the Somnostar 11.2 system.  Electrodes and sensors were applied and monitored per AASM Specifications.   EEG, EOG, Chin and Limb EMG, were sampled at 200 Hz.  ECG, Snore and Nasal Pressure, Thermal Airflow, Respiratory Effort, CPAP Flow and Pressure, Oximetry was sampled at 50 Hz. Digital video and audio were recorded.      BASELINE STUDY  Lights Out was at 22:49 and Lights On at 04:58.  Total recording time (TRT) was 370 minutes, with a total sleep time (TST) of 347.5 minutes.   The patient's sleep latency was 14.5 minutes.  REM latency was 92 minutes.  The sleep efficiency was 93.9 %.     SLEEP ARCHITECTURE: WASO (Wake after sleep onset) was 18.5 minutes.  There were 13 minutes in Stage N1, 172 minutes Stage N2, 75 minutes Stage N3 and 87.5 minutes in Stage REM.  The percentage of Stage N1 was 3.7%, Stage N2 was 49.5%, Stage N3 was 21.6% and Stage R (REM sleep) was 25.2%.   RESPIRATORY ANALYSIS:  There were a total of 56 respiratory events:  19 obstructive apneas, 0 central apneas and 37 hypopneas  with 0 respiratory event related arousals (RERAs).   The total APNEA/HYPOPNEA INDEX (AHI) was 9.7/hour and the total RESPIRATORY DISTURBANCE INDEX was 9.7 /hour.  51 events occurred in REM sleep and 6 events in NREM. The REM AHI was 35.0/hr., versus a non-REM AHI of 1.2/hr. The patient spent 109.5 minutes of total sleep time in the supine position and 238 minutes in non-supine. The supine AHI was 12.6/hr. versus a non-supine AHI of 8.3.  OXYGEN SATURATION & C02:  The Wake baseline 02 saturation was 98%, with the lowest being 77%. Time spent below 89% saturation equaled 13 minutes.   PERIODIC LIMB MOVEMENTS:  The patient had a total of 0 Periodic Limb Movements.  The arousals were noted as: 16 were spontaneous, 0 were associated with PLMs, and 55 were associated with respiratory events.  Audio and video analysis did not show any abnormal or unusual movements, behaviors, phonations or vocalizations. No nocturia.  Snoring was noted. EKG was in keeping with normal sinus rhythm (NSR).   Post-study, the patient indicated that sleep was better than usual.    IMPRESSION:   Mild Obstructive Sleep Apnea (OSA) at AHI of 9.7 which exacerbated under REM sleep to 37/hr.   RECOMMENDATIONS:  1. Recommend Auto-CPAP (APAP) 5-12 cm water with 3 cm EPR.  2. Advise to lose weight, diet and exercise if not contraindicated (BMI indicates morbid obesity). 3. Further information regarding OSA may be obtained from [employer] (www.sleepfoundation.org)  or American Sleep Apnea Association (www.sleepapnea.org). 4. A follow up appointment will be scheduled in the Sleep Clinic at [facility]. The referring provider will be notified of the results.      I certify that I have reviewed the entire raw data recording prior to the issuance of this report in accordance with the Standards of Accreditation of the American Academy of Sleep Medicine (AASM)   Larey Seat, MD      11-07-2017   Diplomat, American Board of Psychiatry and Neurology  Diplomat, American Board of [place] Director, [employer] [facility] [employer]

## 2017-11-10 DIAGNOSIS — M940 Chondrocostal junction syndrome [Tietze]: Secondary | ICD-10-CM | POA: Diagnosis not present

## 2017-11-12 ENCOUNTER — Telehealth: Payer: Self-pay | Admitting: Neurology

## 2017-11-12 NOTE — Telephone Encounter (Signed)
I called pt. I advised pt that Dr. Brett Fairy reviewed their sleep study results and found that pt has mild OSA. Dr. Brett Fairy recommends that pt starts auto CPAP. I reviewed PAP compliance expectations with the pt. Pt is agreeable to starting an auto-PAP. I advised pt that an order will be sent to a DME, [facility], and [facility] will call the pt within about one week after they file with the pt's insurance. [facility] will show the pt how to use the machine, fit for masks, and troubleshoot the auto-PAP if needed. A follow up appt was made for insurance purposes with Cecille Rubin, NP on Feb 6,2019 at 12:45 pm . Pt verbalized understanding to arrive 15 minutes early and bring their auto-PAP. A letter with all of this information in it will be mailed to the pt as a reminder. I verified with the pt that the address we have on file is correct. Pt verbalized understanding of results. Pt had no questions at this time but was encouraged to call back if questions arise.

## 2017-11-12 NOTE — Telephone Encounter (Signed)
-----   Message from Larey Seat, MD sent at 11/07/2017  5:45 PM EST ----- Relatively mild and uncomplicated apnea without signs of phsyiological stress on heart and lung. Recommend auto CPAP due to REM dependent apnea type.   Cc NP [name]

## 2017-11-18 NOTE — Telephone Encounter (Signed)
"  This patient has requested to wait until after January when her deductible starts over to start Cpap therapy. I have set myself a reminder to contact her in January to schedule. I wanted to make you and Dr. Brett Fairy aware incase her follow up appointment was already scheduled."  Received this message in regards to this patient. I have called the patient and made her aware that we will cancel her feb apt and have requested that she calls [place] once she is set up with CPAP so we can get her back on the schedule. Pt verbalized understanding.

## 2017-12-16 DIAGNOSIS — G4700 Insomnia, unspecified: Secondary | ICD-10-CM | POA: Diagnosis not present

## 2018-01-29 ENCOUNTER — Ambulatory Visit: Payer: Self-pay | Admitting: Nurse Practitioner

## 2018-03-05 DIAGNOSIS — I10 Essential (primary) hypertension: Secondary | ICD-10-CM | POA: Diagnosis not present

## 2018-03-05 DIAGNOSIS — E669 Obesity, unspecified: Secondary | ICD-10-CM | POA: Diagnosis not present

## 2018-03-05 DIAGNOSIS — Z6835 Body mass index (BMI) 35.0-35.9, adult: Secondary | ICD-10-CM | POA: Diagnosis not present

## 2018-05-08 DIAGNOSIS — E669 Obesity, unspecified: Secondary | ICD-10-CM | POA: Diagnosis not present

## 2018-05-08 DIAGNOSIS — Z6833 Body mass index (BMI) 33.0-33.9, adult: Secondary | ICD-10-CM | POA: Diagnosis not present

## 2018-05-17 DIAGNOSIS — Z760 Encounter for issue of repeat prescription: Secondary | ICD-10-CM | POA: Diagnosis not present

## 2018-06-06 DIAGNOSIS — N939 Abnormal uterine and vaginal bleeding, unspecified: Secondary | ICD-10-CM | POA: Diagnosis not present

## 2018-07-09 ENCOUNTER — Other Ambulatory Visit: Payer: Self-pay | Admitting: Obstetrics and Gynecology

## 2018-07-09 DIAGNOSIS — N939 Abnormal uterine and vaginal bleeding, unspecified: Secondary | ICD-10-CM | POA: Diagnosis not present

## 2018-07-09 DIAGNOSIS — Z1159 Encounter for screening for other viral diseases: Secondary | ICD-10-CM | POA: Diagnosis not present

## 2018-07-09 DIAGNOSIS — Z01419 Encounter for gynecological examination (general) (routine) without abnormal findings: Secondary | ICD-10-CM | POA: Diagnosis not present

## 2018-07-09 DIAGNOSIS — D251 Intramural leiomyoma of uterus: Secondary | ICD-10-CM | POA: Diagnosis not present

## 2018-07-09 DIAGNOSIS — N898 Other specified noninflammatory disorders of vagina: Secondary | ICD-10-CM | POA: Diagnosis not present

## 2018-07-09 DIAGNOSIS — Z118 Encounter for screening for other infectious and parasitic diseases: Secondary | ICD-10-CM | POA: Diagnosis not present

## 2018-07-22 DIAGNOSIS — Z1231 Encounter for screening mammogram for malignant neoplasm of breast: Secondary | ICD-10-CM | POA: Diagnosis not present

## 2018-07-24 DIAGNOSIS — E559 Vitamin D deficiency, unspecified: Secondary | ICD-10-CM | POA: Diagnosis not present

## 2018-07-24 DIAGNOSIS — Z1389 Encounter for screening for other disorder: Secondary | ICD-10-CM | POA: Diagnosis not present

## 2018-07-24 DIAGNOSIS — R7309 Other abnormal glucose: Secondary | ICD-10-CM | POA: Diagnosis not present

## 2018-07-24 DIAGNOSIS — Z0000 Encounter for general adult medical examination without abnormal findings: Secondary | ICD-10-CM | POA: Diagnosis not present

## 2018-07-24 DIAGNOSIS — E669 Obesity, unspecified: Secondary | ICD-10-CM | POA: Diagnosis not present

## 2018-07-24 DIAGNOSIS — I10 Essential (primary) hypertension: Secondary | ICD-10-CM | POA: Diagnosis not present

## 2018-07-24 LAB — LIPID PANEL
Cholesterol: 216 — AB (ref 0–200)
HDL: 61 (ref 35–70)
LDL Cholesterol: 124
LDl/HDL Ratio: 2
Triglycerides: 154 (ref 40–160)

## 2018-07-24 LAB — HEPATIC FUNCTION PANEL
ALT: 39 — AB (ref 7–35)
AST: 34 (ref 13–35)
Alkaline Phosphatase: 55 (ref 25–125)
Bilirubin, Total: 0.5

## 2018-07-24 LAB — CBC AND DIFFERENTIAL
HCT: 38 (ref 36–46)
Hemoglobin: 12.1 (ref 12.0–16.0)
Platelets: 298 (ref 150–399)
WBC: 4.2

## 2018-07-24 LAB — BASIC METABOLIC PANEL
BUN: 14 (ref 4–21)
Creatinine: 0.9 (ref 0.5–1.1)
Glucose: 84
Potassium: 4.1 (ref 3.4–5.3)
Sodium: 136 — AB (ref 137–147)

## 2018-07-24 LAB — TSH: TSH: 0.93 (ref 0.41–5.90)

## 2018-07-24 LAB — VITAMIN D 25 HYDROXY (VIT D DEFICIENCY, FRACTURES): Vit D, 25-Hydroxy: 17.9

## 2018-07-24 LAB — HEMOGLOBIN A1C: Hemoglobin A1C: 5.3

## 2018-08-11 DIAGNOSIS — N760 Acute vaginitis: Secondary | ICD-10-CM | POA: Diagnosis not present

## 2018-08-11 DIAGNOSIS — N898 Other specified noninflammatory disorders of vagina: Secondary | ICD-10-CM | POA: Diagnosis not present

## 2018-08-24 DIAGNOSIS — H5213 Myopia, bilateral: Secondary | ICD-10-CM | POA: Diagnosis not present

## 2018-08-24 DIAGNOSIS — Z0100 Encounter for examination of eyes and vision without abnormal findings: Secondary | ICD-10-CM | POA: Diagnosis not present

## 2018-09-03 DIAGNOSIS — N939 Abnormal uterine and vaginal bleeding, unspecified: Secondary | ICD-10-CM | POA: Diagnosis not present

## 2018-09-13 ENCOUNTER — Encounter: Payer: Self-pay | Admitting: Nurse Practitioner

## 2018-09-13 DIAGNOSIS — M797 Fibromyalgia: Secondary | ICD-10-CM | POA: Insufficient documentation

## 2018-09-13 DIAGNOSIS — J309 Allergic rhinitis, unspecified: Secondary | ICD-10-CM | POA: Insufficient documentation

## 2018-09-13 DIAGNOSIS — R7309 Other abnormal glucose: Secondary | ICD-10-CM | POA: Insufficient documentation

## 2018-09-13 DIAGNOSIS — F419 Anxiety disorder, unspecified: Secondary | ICD-10-CM

## 2018-09-13 DIAGNOSIS — E559 Vitamin D deficiency, unspecified: Secondary | ICD-10-CM | POA: Insufficient documentation

## 2018-09-13 DIAGNOSIS — I10 Essential (primary) hypertension: Secondary | ICD-10-CM | POA: Insufficient documentation

## 2018-09-13 DIAGNOSIS — E669 Obesity, unspecified: Secondary | ICD-10-CM

## 2018-09-25 ENCOUNTER — Ambulatory Visit: Payer: 59 | Admitting: Nurse Practitioner

## 2018-10-06 DIAGNOSIS — N760 Acute vaginitis: Secondary | ICD-10-CM | POA: Diagnosis not present

## 2018-10-09 ENCOUNTER — Ambulatory Visit: Payer: 59 | Admitting: Nurse Practitioner

## 2018-10-09 ENCOUNTER — Encounter: Payer: Self-pay | Admitting: Nurse Practitioner

## 2018-10-09 ENCOUNTER — Telehealth: Payer: Self-pay

## 2018-10-09 VITALS — BP 110/70 | HR 104 | Temp 97.7°F | Ht 68.5 in | Wt 228.0 lb

## 2018-10-09 DIAGNOSIS — Z1159 Encounter for screening for other viral diseases: Secondary | ICD-10-CM | POA: Diagnosis not present

## 2018-10-09 NOTE — Patient Instructions (Signed)
Hepatitis C Hepatitis C is a liver infection. It is caused by a germ that can spread through blood and other bodily fluids. Your doctor will use blood and liver tests to:  Check for this infection.  Decide how to treat you.  Check your health after treatment.  Follow these instructions at home:  Rest.  Do not take any medicine unless your doctor says it is okay. This includes over-the-counter medicine and birth control pills.  Do not drink alcohol.  Do not have sex until your doctor says it is okay.  Do not share toothbrushes, nail clippers, razors, or needles.  Take all medicines as told by your doctor. Contact a doctor if:  You have a fever.  Your belly (abdomen) hurts.  Your pee (urine) is dark.  Your poop (bowel movement) is the color of clay.  You have joint pain. Get help right away if:  You feel more and more tired (fatigued).  You feel more and more weak.  You do not feel like eating.  You feel sick to your stomach (nauseous) or throw up (vomit).  Your skin or the whites of your eyes turn yellow (jaundice) or turn more yellow than they were before.  You bruise or bleed easily. This information is not intended to replace advice given to you by your health care provider. Make sure you discuss any questions you have with your health care provider. Document Released: 11/22/2008 Document Revised: 05/17/2016 Document Reviewed: 03/24/2014 Elsevier Interactive Patient Education  2017 Elsevier Inc.  

## 2018-10-09 NOTE — Telephone Encounter (Signed)
Pt called stating she had bloodwork done at urgent care and was told she has hepatits C pt stated she was told to contact us. Pt also stated she does not have any new partners or anything. Pt stated the only thing new can think of is that she got 2 new tattoos done about 3 weeks ago.   I called pt and scheduled her an appt for today. YRL,RMA

## 2018-10-09 NOTE — Progress Notes (Signed)
  Subjective:     Patient ID: Lauren Carr , female    DOB: 02/06/1976 , 42 y.o.   MRN: [id number]   Here today due to having a bacterial infection. They took blood work STD panel including Hepatitis C which was positive.  Biopsy 19th August with Dr. Garwin Brothers.  The only new tattoos in last 3 weeks.  [employer] [facility] [employer].  She had a HCV done with Dr. Garwin Brothers on 07/10/18 which was negative.   She had 2 new tattoos 3 weeks ago, had fatigue and felt bad about 1 week after.  Denies new joint or aching pain.  Denies fever or chills.     No past medical history on file.    Current Outpatient Medications:  .  buPROPion (WELLBUTRIN XL) 150 MG 24 hr tablet, Take 150 mg by mouth daily., Disp: , Rfl:  .  hydrochlorothiazide (HYDRODIURIL) 12.5 MG tablet, , Disp: , Rfl:  .  Insulin Pen Needle (NOVOFINE PLUS) 32G X 4 MM MISC, by Does not apply route. Use with saxenda once per day, Disp: , Rfl:  .  Liraglutide -Weight Management (SAXENDA) 18 MG/3ML SOPN, Inject into the skin. Inject 3mg  by subcutaneous route every day in the abdomen, thigh or upper arm, Disp: , Rfl:  .  telmisartan (MICARDIS) 20 MG tablet, Take 20 mg by mouth daily., Disp: , Rfl:    No Known Allergies   Review of Systems  Constitutional: Positive for fatigue. Negative for fever.  HENT: Negative.   Respiratory: Negative.   Cardiovascular: Negative.   Genitourinary: Negative.   Skin: Negative.      Today's Vitals   10/09/18 1156  BP: 110/70  Pulse: (!) 104  Temp: 97.7 F (36.5 C)  SpO2: 97%  Weight: 228 lb (103.4 kg)  Height: 5' 8.5" (1.74 m)  PainSc: 0-No pain   Body mass index is 34.16 kg/m.   Objective:  Physical Exam  Constitutional: She is oriented to person, place, and time. She appears well-developed and well-nourished.  Neck: Normal range of motion. Neck supple.  Cardiovascular: Normal rate, regular rhythm, normal heart sounds and intact distal pulses.  Pulmonary/Chest: Effort normal and breath sounds  normal.  Neurological: She is alert and oriented to person, place, and time.  Skin: Skin is warm and dry.        Assessment And Plan:     1. Encounter for hepatitis C screening test for low risk patient  Will recheck her Hepatitis C - Hepatitis C antibody - HCV RNA quant rflx ultra or genotyp([facility])       [name], FNP

## 2018-10-10 ENCOUNTER — Ambulatory Visit: Payer: Self-pay | Admitting: Nurse Practitioner

## 2018-10-10 LAB — HEPATITIS C ANTIBODY: Hep C Virus Ab: <0.1 s/co ratio (ref 0.0–0.9)

## 2018-10-10 LAB — HCV RNA QUANT RFLX ULTRA OR GENOTYP: HCV Quant Baseline: NOT DETECTED IU/mL

## 2018-10-15 ENCOUNTER — Telehealth: Payer: Self-pay

## 2018-10-15 NOTE — Telephone Encounter (Signed)
Patient called stating her labs came back negative when she had them done at urgent care she stated whoever read her results to her read them incorrectly. Pt stated she is bringing her results in with her to her next appointment with you so you can see them. Pt stated the person who read them thought 0.1 and 1.0 were the samething. YRL,RMA

## 2018-10-30 ENCOUNTER — Other Ambulatory Visit: Payer: Self-pay | Admitting: Nurse Practitioner

## 2018-11-24 ENCOUNTER — Ambulatory Visit: Payer: 59 | Admitting: Nurse Practitioner

## 2018-11-24 ENCOUNTER — Encounter: Payer: Self-pay | Admitting: Nurse Practitioner

## 2018-11-24 VITALS — BP 120/80 | HR 86 | Temp 97.6°F | Ht 67.5 in | Wt 230.0 lb

## 2018-11-24 DIAGNOSIS — Z6835 Body mass index (BMI) 35.0-35.9, adult: Secondary | ICD-10-CM

## 2018-11-24 DIAGNOSIS — G4733 Obstructive sleep apnea (adult) (pediatric): Secondary | ICD-10-CM | POA: Diagnosis not present

## 2018-11-24 DIAGNOSIS — F329 Major depressive disorder, single episode, unspecified: Secondary | ICD-10-CM

## 2018-11-24 DIAGNOSIS — F32A Depression, unspecified: Secondary | ICD-10-CM

## 2018-11-24 DIAGNOSIS — Z23 Encounter for immunization: Secondary | ICD-10-CM | POA: Diagnosis not present

## 2018-11-24 MED ORDER — LIRAGLUTIDE -WEIGHT MANAGEMENT 18 MG/3ML ~~LOC~~ SOPN: 3.0000 mg | PEN_INJECTOR | Freq: Every day | SUBCUTANEOUS | 1 refills | Status: DC

## 2018-11-24 MED ORDER — SERTRALINE HCL 25 MG PO TABS: 25.0000 mg | ORAL_TABLET | Freq: Every day | ORAL | 1 refills | Status: DC

## 2018-11-24 NOTE — Progress Notes (Signed)
Subjective:     Patient ID: Lauren Carr , female    DOB: 1976/10/27 , 42 y.o.   MRN: [id number]   Chief Complaint  Patient presents with  . Hepatitis C  . Depression      HPI  She had a repeat Hepatitis C done which was negative.  There was some confusion with the results at the previous facility where she had been seen.    Depression       The patient presents with depression.  This is a chronic problem.  The current episode started 1 to 4 weeks ago.   The onset quality is undetermined.   The problem occurs constantly.  Associated symptoms include no fatigue and no headaches.  Past treatments include SSRIs - Selective serotonin reuptake inhibitors.  Compliance with treatment is good.  Previous treatment provided significant relief.  Past medical history includes anxiety and depression.      No past medical history on file.   Family History  Problem Relation Age of Onset  . Breast cancer Mother   . CAD Mother   . Hypertension Mother   . Cancer - Prostate Father   . Cancer - Cervical Maternal Grandmother   . Diabetes Maternal Grandmother      Current Outpatient Medications:  .  ergocalciferol (VITAMIN D2) 1.25 MG (50000 UT) capsule, Take 50,000 Units by mouth once a week., Disp: , Rfl:  .  hydrochlorothiazide (HYDRODIURIL) 12.5 MG tablet, , Disp: , Rfl:  .  Insulin Pen Needle (NOVOFINE PLUS) 32G X 4 MM MISC, by Does not apply route. Use with saxenda once per day, Disp: , Rfl:  .  Liraglutide -Weight Management (SAXENDA) 18 MG/3ML SOPN, Inject into the skin. Inject 3mg  by subcutaneous route every day in the abdomen, thigh or upper arm, Disp: , Rfl:  .  sertraline (ZOLOFT) 25 MG tablet, Take 25 mg by mouth daily., Disp: , Rfl:  .  telmisartan (MICARDIS) 20 MG tablet, TAKE 1 TABLET BY MOUTH ONCE DAILY, Disp: 90 tablet, Rfl: 1   No Known Allergies   Review of Systems  Constitutional: Negative.  Negative for fatigue.  Eyes: Negative for visual disturbance.  Respiratory:  Negative.  Negative for shortness of breath.   Cardiovascular: Negative.  Negative for chest pain, palpitations and leg swelling.  Gastrointestinal: Negative.   Endocrine: Negative.   Musculoskeletal: Negative.   Skin: Negative.   Neurological: Negative for dizziness, weakness and headaches.  Psychiatric/Behavioral: Positive for depression. Negative for confusion. The patient is not nervous/anxious.      Today's Vitals   11/24/18 0955  BP: 120/80  Pulse: 86  Temp: 97.6 F (36.4 C)  TempSrc: Oral  SpO2: 97%  Weight: 230 lb (104.3 kg)  Height: 5' 7.5" (1.715 m)  PainSc: 0-No pain   Body mass index is 35.49 kg/m.   Objective:  Physical Exam  Constitutional: She is oriented to person, place, and time. She appears well-developed and well-nourished.  Cardiovascular: Normal rate, regular rhythm and normal heart sounds.  Pulmonary/Chest: Effort normal and breath sounds normal.  Neurological: She is alert and oriented to person, place, and time.  Skin: Skin is warm and dry.        Assessment And Plan:   1. Depression, unspecified depression type  Will restart her on zoloft, has been out for 1 week. Initially started by Dr. Brett Fairy last year.  - sertraline (ZOLOFT) 25 MG tablet; Take 1 tablet (25 mg total) by mouth daily.  Dispense: 90 tablet;  Refill: 1  2. Class 2 severe obesity due to excess calories with serious comorbidity and body mass index (BMI) of 35.0 to 35.9 in adult [facility])  Chronic  Discussed healthy diet and regular exercise options   Encouraged to exercise at least 150 minutes per week with 2 days of strength training  Restart Saxenda, she is to titrate weekly, discussed side effects of nausea, abdominal pain or difficulty swallowing to notify office.  Return in 2 months for weight check. - Liraglutide -Weight Management (SAXENDA) 18 MG/3ML SOPN; Inject 3 mg into the skin daily. Inject 3mg  by subcutaneous route every day in the abdomen, thigh or upper arm   Dispense: 5 pen; Refill: 1  3. Need for influenza vaccination  Influenza vaccine administered  Encouraged to take Tylenol as needed for fever or muscle aches. - Flu Vaccine QUAD 6+ mos PF IM (Fluarix Quad PF)  4. OSA (obstructive sleep apnea)  She was diagnosed last year by Dr. Brett Fairy but did not get the CPAP due to the cost.     [name], FNP

## 2018-12-17 ENCOUNTER — Other Ambulatory Visit: Payer: Self-pay | Admitting: Nurse Practitioner

## 2018-12-17 DIAGNOSIS — F32A Depression, unspecified: Secondary | ICD-10-CM

## 2018-12-17 DIAGNOSIS — F329 Major depressive disorder, single episode, unspecified: Secondary | ICD-10-CM

## 2019-01-15 DIAGNOSIS — M5032 Other cervical disc degeneration, mid-cervical region, unspecified level: Secondary | ICD-10-CM | POA: Diagnosis not present

## 2019-01-15 DIAGNOSIS — M9901 Segmental and somatic dysfunction of cervical region: Secondary | ICD-10-CM | POA: Diagnosis not present

## 2019-01-15 DIAGNOSIS — M5134 Other intervertebral disc degeneration, thoracic region: Secondary | ICD-10-CM | POA: Diagnosis not present

## 2019-01-19 DIAGNOSIS — M5032 Other cervical disc degeneration, mid-cervical region, unspecified level: Secondary | ICD-10-CM | POA: Diagnosis not present

## 2019-01-19 DIAGNOSIS — M9901 Segmental and somatic dysfunction of cervical region: Secondary | ICD-10-CM | POA: Diagnosis not present

## 2019-01-19 DIAGNOSIS — M5134 Other intervertebral disc degeneration, thoracic region: Secondary | ICD-10-CM | POA: Diagnosis not present

## 2019-01-26 ENCOUNTER — Ambulatory Visit: Payer: 59 | Admitting: Nurse Practitioner

## 2019-01-28 DIAGNOSIS — M5032 Other cervical disc degeneration, mid-cervical region, unspecified level: Secondary | ICD-10-CM | POA: Diagnosis not present

## 2019-01-28 DIAGNOSIS — M9901 Segmental and somatic dysfunction of cervical region: Secondary | ICD-10-CM | POA: Diagnosis not present

## 2019-01-28 DIAGNOSIS — M5134 Other intervertebral disc degeneration, thoracic region: Secondary | ICD-10-CM | POA: Diagnosis not present

## 2019-01-29 ENCOUNTER — Encounter: Payer: Self-pay | Admitting: Nurse Practitioner

## 2019-02-05 DIAGNOSIS — M9901 Segmental and somatic dysfunction of cervical region: Secondary | ICD-10-CM | POA: Diagnosis not present

## 2019-02-05 DIAGNOSIS — M5032 Other cervical disc degeneration, mid-cervical region, unspecified level: Secondary | ICD-10-CM | POA: Diagnosis not present

## 2019-02-05 DIAGNOSIS — M5134 Other intervertebral disc degeneration, thoracic region: Secondary | ICD-10-CM | POA: Diagnosis not present

## 2019-02-10 ENCOUNTER — Ambulatory Visit
Admission: EM | Admit: 2019-02-10 | Discharge: 2019-02-10 | Disposition: A | Discharge status: Home / Self Care | Payer: 59 | Attending: Family Medicine | Admitting: Family Medicine

## 2019-02-10 DIAGNOSIS — N760 Acute vaginitis: Secondary | ICD-10-CM | POA: Insufficient documentation

## 2019-02-10 HISTORY — DX: Essential (primary) hypertension: I10

## 2019-02-10 MED ORDER — METRONIDAZOLE 500 MG PO TABS: 500.0000 mg | ORAL_TABLET | Freq: Two times a day (BID) | ORAL | 0 refills | Status: DC

## 2019-02-10 NOTE — ED Provider Notes (Signed)
[facility]    CSN: [id number] Arrival date & time: 02/10/19  1646     History   Chief Complaint Chief Complaint  Patient presents with  . Vaginal Discharge    HPI Lauren Carr is a 43 y.o. female.   Pt states had unprotective intercourse with her ex 2wks ago, now having vaginal discharge with an odor     Past Medical History:  Diagnosis Date  . Hypertension     Patient Active Problem List   Diagnosis Date Noted  . OSA (obstructive sleep apnea) 11/24/2018  . Fibromyalgia 09/13/2018  . Allergic rhinitis 09/13/2018  . Hypertension 09/13/2018  . Abnormal glucose 09/13/2018  . Obesity 09/13/2018  . Vitamin D deficiency 09/13/2018  . Anxiety 09/13/2018  . Adjustment insomnia 08/27/2017  . Snoring 08/27/2017  . Heart palpitations 08/27/2017  . Diaphoresis 08/27/2017  . 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 CESAREAN SECTION    . CHOLECYSTECTOMY      OB History   No obstetric history on file.      Home Medications    Prior to Admission medications   Medication Sig Start Date End Date Taking? Authorizing Provider  ergocalciferol (VITAMIN D2) 1.25 MG (50000 UT) capsule Take 50,000 Units by mouth once a week.    [provider]  hydrochlorothiazide (HYDRODIURIL) 12.5 MG tablet  07/29/17   [provider]  Insulin Pen Needle (NOVOFINE PLUS) 32G X 4 MM MISC by Does not apply route. Use with saxenda once per day    [provider]  Liraglutide -Weight Management (SAXENDA) 18 MG/3ML SOPN Inject 3 mg into the skin daily. Inject 3mg  by subcutaneous route every day in the abdomen, thigh or upper arm 11/24/18   [name], FNP  metroNIDAZOLE (FLAGYL) 500 MG tablet Take 1 tablet (500 mg total) by mouth 2 (two) times daily. 02/10/19   Robyn Haber, MD  sertraline (ZOLOFT) 25 MG tablet Take 1 tablet (25 mg total) by mouth daily. 11/24/18   [name], FNP  telmisartan  (MICARDIS) 20 MG tablet TAKE 1 TABLET BY MOUTH ONCE DAILY 10/31/18   [name], FNP    Family History Family History  Problem Relation Age of Onset  . Breast cancer Mother   . CAD Mother   . Hypertension Mother   . Cancer - Prostate Father   . Cancer - Cervical Maternal Grandmother   . Diabetes Maternal Grandmother     Social History Social History   Tobacco Use  . Smoking status: Never Smoker  . Smokeless tobacco: Never Used  Substance Use Topics  . Alcohol use: Yes    Alcohol/week: 2.0 standard drinks    Types: 2 Glasses of wine per week  . Drug use: No     Allergies   Patient has no known allergies.   Review of Systems Review of Systems   Physical Exam Triage Vital Signs ED Triage Vitals  Enc Vitals Group     BP 02/10/19 1705 130/79     Pulse Rate 02/10/19 1705 88     Resp 02/10/19 1705 18     Temp 02/10/19 1705 98.4 F (36.9 C)     Temp Source 02/10/19 1705 Oral     SpO2 02/10/19 1705 99 %     Weight --      Height --      Head Circumference --      Peak Flow --  Pain Score 02/10/19 1706 0     Pain Loc --      Pain Edu? --      Excl. in [place]? --    No data found.  Updated Vital Signs BP 130/79 (BP Location: Left Arm)   Pulse 88   Temp 98.4 F (36.9 C) (Oral)   Resp 18   LMP 02/01/2019   SpO2 99%   Physical Exam Vitals signs and nursing note reviewed.  Constitutional:      Appearance: Normal appearance.  HENT:     Head: Normocephalic and atraumatic.     Nose: Nose normal.     Mouth/Throat:     Pharynx: Oropharynx is clear.  Eyes:     Conjunctiva/sclera: Conjunctivae normal.  Pulmonary:     Effort: Pulmonary effort is normal.  Abdominal:     Palpations: Abdomen is soft.     Tenderness: There is no abdominal tenderness.  Musculoskeletal: Normal range of motion.  Skin:    General: Skin is warm.  Neurological:     General: No focal deficit present.     Mental Status: She is alert.  Psychiatric:        Mood and Affect: Mood  normal.      UC Treatments / Results  Labs (all labs ordered are listed, but only abnormal results are displayed) Labs Reviewed  CERVICOVAGINAL ANCILLARY ONLY    EKG None  Radiology No results found.  Procedures Procedures (including critical care time)  Medications Ordered in UC Medications - No data to display  Initial Impression / Assessment and Plan / UC Course  I have reviewed the triage vital signs and the nursing notes.  Pertinent labs & imaging results that were available during my care of the patient were reviewed by me and considered in my medical decision making (see chart for details).    Final Clinical Impressions(s) / UC Diagnoses   Final diagnoses:  Vaginitis and vulvovaginitis   Discharge Instructions   None    ED Prescriptions    Medication Sig Dispense Auth. Provider   metroNIDAZOLE (FLAGYL) 500 MG tablet Take 1 tablet (500 mg total) by mouth 2 (two) times daily. 14 tablet Robyn Haber, MD     Controlled Substance Prescriptions [place] Controlled Substance Registry consulted? Not Applicable   Robyn Haber, MD 02/10/19 1725

## 2019-02-10 NOTE — ED Triage Notes (Signed)
Pt states had unprotective intercourse with her ex 2wks ago, now having vaginal discharge with an odor

## 2019-02-12 LAB — CERVICOVAGINAL ANCILLARY ONLY
Bacterial vaginitis: POSITIVE — AB
Candida vaginitis: NEGATIVE
Chlamydia: NEGATIVE
Neisseria Gonorrhea: NEGATIVE
Trichomonas: NEGATIVE

## 2019-02-27 DIAGNOSIS — M5032 Other cervical disc degeneration, mid-cervical region, unspecified level: Secondary | ICD-10-CM | POA: Diagnosis not present

## 2019-02-27 DIAGNOSIS — M9901 Segmental and somatic dysfunction of cervical region: Secondary | ICD-10-CM | POA: Diagnosis not present

## 2019-02-27 DIAGNOSIS — M5134 Other intervertebral disc degeneration, thoracic region: Secondary | ICD-10-CM | POA: Diagnosis not present

## 2019-03-18 ENCOUNTER — Other Ambulatory Visit: Payer: Self-pay | Admitting: Nurse Practitioner

## 2019-04-18 ENCOUNTER — Other Ambulatory Visit: Payer: Self-pay

## 2019-04-18 ENCOUNTER — Inpatient Hospital Stay (HOSPITAL_BASED_OUTPATIENT_CLINIC_OR_DEPARTMENT_OTHER)
Admission: EM | Admit: 2019-04-18 | Discharge: 2019-04-20 | DRG: 282 | Disposition: A | Discharge status: Home / Self Care | Payer: 59 | Attending: Cardiology | Admitting: Cardiology

## 2019-04-18 ENCOUNTER — Encounter (HOSPITAL_BASED_OUTPATIENT_CLINIC_OR_DEPARTMENT_OTHER): Payer: Self-pay | Admitting: Emergency Medicine

## 2019-04-18 DIAGNOSIS — Z6834 Body mass index (BMI) 34.0-34.9, adult: Secondary | ICD-10-CM

## 2019-04-18 DIAGNOSIS — F1990 Other psychoactive substance use, unspecified, uncomplicated: Secondary | ICD-10-CM | POA: Diagnosis present

## 2019-04-18 DIAGNOSIS — Z8249 Family history of ischemic heart disease and other diseases of the circulatory system: Secondary | ICD-10-CM

## 2019-04-18 DIAGNOSIS — I10 Essential (primary) hypertension: Secondary | ICD-10-CM | POA: Diagnosis present

## 2019-04-18 DIAGNOSIS — E785 Hyperlipidemia, unspecified: Secondary | ICD-10-CM | POA: Diagnosis present

## 2019-04-18 DIAGNOSIS — G4733 Obstructive sleep apnea (adult) (pediatric): Secondary | ICD-10-CM | POA: Diagnosis present

## 2019-04-18 DIAGNOSIS — E876 Hypokalemia: Secondary | ICD-10-CM

## 2019-04-18 DIAGNOSIS — I214 Non-ST elevation (NSTEMI) myocardial infarction: Secondary | ICD-10-CM | POA: Diagnosis not present

## 2019-04-18 DIAGNOSIS — F419 Anxiety disorder, unspecified: Secondary | ICD-10-CM | POA: Diagnosis present

## 2019-04-18 DIAGNOSIS — Z803 Family history of malignant neoplasm of breast: Secondary | ICD-10-CM

## 2019-04-18 DIAGNOSIS — Z79899 Other long term (current) drug therapy: Secondary | ICD-10-CM

## 2019-04-18 DIAGNOSIS — I249 Acute ischemic heart disease, unspecified: Secondary | ICD-10-CM | POA: Diagnosis present

## 2019-04-18 DIAGNOSIS — E669 Obesity, unspecified: Secondary | ICD-10-CM | POA: Diagnosis present

## 2019-04-18 DIAGNOSIS — Z9049 Acquired absence of other specified parts of digestive tract: Secondary | ICD-10-CM

## 2019-04-18 HISTORY — DX: Obstructive sleep apnea (adult) (pediatric): G47.33

## 2019-04-18 NOTE — ED Triage Notes (Signed)
Pt states that for the past hour she has been having chest tightness and slight shortness of breath. States she was standing in her kitchen cooking when this began. + lightheadedness.

## 2019-04-19 ENCOUNTER — Emergency Department (HOSPITAL_BASED_OUTPATIENT_CLINIC_OR_DEPARTMENT_OTHER): Payer: 59

## 2019-04-19 ENCOUNTER — Encounter (HOSPITAL_BASED_OUTPATIENT_CLINIC_OR_DEPARTMENT_OTHER): Payer: Self-pay | Admitting: Emergency Medicine

## 2019-04-19 ENCOUNTER — Inpatient Hospital Stay (HOSPITAL_COMMUNITY): Payer: 59

## 2019-04-19 DIAGNOSIS — F419 Anxiety disorder, unspecified: Secondary | ICD-10-CM | POA: Diagnosis present

## 2019-04-19 DIAGNOSIS — E7800 Pure hypercholesterolemia, unspecified: Secondary | ICD-10-CM

## 2019-04-19 DIAGNOSIS — F1990 Other psychoactive substance use, unspecified, uncomplicated: Secondary | ICD-10-CM | POA: Diagnosis present

## 2019-04-19 DIAGNOSIS — E785 Hyperlipidemia, unspecified: Secondary | ICD-10-CM | POA: Diagnosis present

## 2019-04-19 DIAGNOSIS — E669 Obesity, unspecified: Secondary | ICD-10-CM | POA: Diagnosis not present

## 2019-04-19 DIAGNOSIS — I214 Non-ST elevation (NSTEMI) myocardial infarction: Secondary | ICD-10-CM

## 2019-04-19 DIAGNOSIS — Z9049 Acquired absence of other specified parts of digestive tract: Secondary | ICD-10-CM | POA: Diagnosis not present

## 2019-04-19 DIAGNOSIS — Z8249 Family history of ischemic heart disease and other diseases of the circulatory system: Secondary | ICD-10-CM | POA: Diagnosis not present

## 2019-04-19 DIAGNOSIS — G4733 Obstructive sleep apnea (adult) (pediatric): Secondary | ICD-10-CM | POA: Diagnosis present

## 2019-04-19 DIAGNOSIS — I10 Essential (primary) hypertension: Secondary | ICD-10-CM | POA: Diagnosis not present

## 2019-04-19 DIAGNOSIS — Z803 Family history of malignant neoplasm of breast: Secondary | ICD-10-CM | POA: Diagnosis not present

## 2019-04-19 DIAGNOSIS — R079 Chest pain, unspecified: Secondary | ICD-10-CM | POA: Diagnosis not present

## 2019-04-19 DIAGNOSIS — I249 Acute ischemic heart disease, unspecified: Secondary | ICD-10-CM | POA: Diagnosis present

## 2019-04-19 DIAGNOSIS — E876 Hypokalemia: Secondary | ICD-10-CM | POA: Diagnosis not present

## 2019-04-19 DIAGNOSIS — Z79899 Other long term (current) drug therapy: Secondary | ICD-10-CM | POA: Diagnosis not present

## 2019-04-19 DIAGNOSIS — Z6834 Body mass index (BMI) 34.0-34.9, adult: Secondary | ICD-10-CM | POA: Diagnosis not present

## 2019-04-19 LAB — CBC WITH DIFFERENTIAL/PLATELET
Abs Immature Granulocytes: 0.01 10*3/uL (ref 0.00–0.07)
Basophils Absolute: 0 10*3/uL (ref 0.0–0.1)
Basophils Relative: 0 %
Eosinophils Absolute: 0.1 10*3/uL (ref 0.0–0.5)
Eosinophils Relative: 1 %
HCT: 39.5 % (ref 36.0–46.0)
Hemoglobin: 12.7 g/dL (ref 12.0–15.0)
Immature Granulocytes: 0 %
Lymphocytes Relative: 41 %
Lymphs Abs: 3.3 10*3/uL (ref 0.7–4.0)
MCH: 26.8 pg (ref 26.0–34.0)
MCHC: 32.2 g/dL (ref 30.0–36.0)
MCV: 83.3 fL (ref 80.0–100.0)
Monocytes Absolute: 0.7 10*3/uL (ref 0.1–1.0)
Monocytes Relative: 9 %
Neutro Abs: 4 10*3/uL (ref 1.7–7.7)
Neutrophils Relative %: 49 %
Platelets: 303 10*3/uL (ref 150–400)
RBC: 4.74 MIL/uL (ref 3.87–5.11)
RDW: 13.9 % (ref 11.5–15.5)
WBC: 8.2 10*3/uL (ref 4.0–10.5)
nRBC: 0 % (ref 0.0–0.2)

## 2019-04-19 LAB — HEPARIN LEVEL (UNFRACTIONATED)
Heparin Unfractionated: >2.2 IU/mL — ABNORMAL HIGH (ref 0.30–0.70)
Heparin Unfractionated: 0.65 IU/mL (ref 0.30–0.70)
Heparin Unfractionated: 0.32 IU/mL (ref 0.30–0.70)

## 2019-04-19 LAB — BASIC METABOLIC PANEL
Anion gap: 13 (ref 5–15)
BUN: 16 mg/dL (ref 6–20)
CO2: 20 mmol/L — ABNORMAL LOW (ref 22–32)
Calcium: 9.8 mg/dL (ref 8.9–10.3)
Chloride: 103 mmol/L (ref 98–111)
Creatinine, Ser: 0.86 mg/dL (ref 0.44–1.00)
GFR calc Af Amer: >60 mL/min (ref >60)
GFR calc non Af Amer: >60 mL/min (ref >60)
Glucose, Bld: 101 mg/dL — ABNORMAL HIGH (ref 70–99)
Potassium: 2.7 mmol/L — CL (ref 3.5–5.1)
Sodium: 136 mmol/L (ref 135–145)

## 2019-04-19 LAB — ECHOCARDIOGRAM LIMITED
Height: 69 in
Weight: 3790.4 oz

## 2019-04-19 LAB — LIPID PANEL
Cholesterol: 203 mg/dL — ABNORMAL HIGH (ref 0–200)
HDL: 53 mg/dL (ref >40)
LDL Cholesterol: 125 mg/dL — ABNORMAL HIGH (ref 0–99)
Total CHOL/HDL Ratio: 3.8 RATIO
Triglycerides: 125 mg/dL (ref <150)
VLDL: 25 mg/dL (ref 0–40)

## 2019-04-19 LAB — BRAIN NATRIURETIC PEPTIDE: B Natriuretic Peptide: 34.2 pg/mL (ref 0.0–100.0)

## 2019-04-19 LAB — TROPONIN I
Troponin I: 0.11 ng/mL (ref <0.03)
Troponin I: 0.21 ng/mL (ref <0.03)

## 2019-04-19 LAB — T4, FREE: Free T4: 0.92 ng/dL (ref 0.82–1.77)

## 2019-04-19 LAB — HIV ANTIBODY (ROUTINE TESTING W REFLEX): HIV Screen 4th Generation wRfx: NONREACTIVE

## 2019-04-19 LAB — TSH: TSH: 1.892 u[IU]/mL (ref 0.350–4.500)

## 2019-04-19 LAB — PREGNANCY, URINE: Preg Test, Ur: NEGATIVE

## 2019-04-19 MED ORDER — ACETAMINOPHEN 325 MG PO TABS: 650.0000 mg | ORAL_TABLET | ORAL | Status: DC | PRN

## 2019-04-19 MED ORDER — ASPIRIN EC 81 MG PO TBEC
81.0000 mg | DELAYED_RELEASE_TABLET | Freq: Every day | ORAL | Status: DC
  Administered 2019-04-20: 81 mg via ORAL
  Filled 2019-04-19 (×2): qty 1

## 2019-04-19 MED ORDER — POTASSIUM CHLORIDE CRYS ER 20 MEQ PO TBCR
40.0000 meq | EXTENDED_RELEASE_TABLET | Freq: Once | ORAL | Status: AC
  Administered 2019-04-19: 40 meq via ORAL
  Filled 2019-04-19: qty 2

## 2019-04-19 MED ORDER — SODIUM CHLORIDE 0.9% FLUSH: 3.0000 mL | INTRAVENOUS | Status: DC | PRN

## 2019-04-19 MED ORDER — HEPARIN (PORCINE) 25000 UT/250ML-% IV SOLN
900.0000 [IU]/h | INTRAVENOUS | Status: DC
  Administered 2019-04-19: 800 [IU]/h via INTRAVENOUS
  Administered 2019-04-19: 1100 [IU]/h via INTRAVENOUS
  Filled 2019-04-19 (×2): qty 250

## 2019-04-19 MED ORDER — SODIUM CHLORIDE 0.9 % IV SOLN: 250.0000 mL | INTRAVENOUS | Status: DC | PRN

## 2019-04-19 MED ORDER — SODIUM CHLORIDE 0.9% FLUSH: 3.0000 mL | Freq: Two times a day (BID) | INTRAVENOUS | Status: DC

## 2019-04-19 MED ORDER — IRBESARTAN 75 MG PO TABS
75.0000 mg | ORAL_TABLET | Freq: Every day | ORAL | Status: DC
  Administered 2019-04-19 – 2019-04-20 (×2): 75 mg via ORAL
  Filled 2019-04-19 (×2): qty 1

## 2019-04-19 MED ORDER — HEPARIN BOLUS VIA INFUSION
4000.0000 [IU] | Freq: Once | INTRAVENOUS | Status: AC
  Administered 2019-04-19: 4000 [IU] via INTRAVENOUS

## 2019-04-19 MED ORDER — METOPROLOL TARTRATE 5 MG/5ML IV SOLN
5.0000 mg | Freq: Once | INTRAVENOUS | Status: AC
  Administered 2019-04-19: 5 mg via INTRAVENOUS
  Filled 2019-04-19: qty 5

## 2019-04-19 MED ORDER — FUROSEMIDE 10 MG/ML IJ SOLN
20.0000 mg | Freq: Once | INTRAMUSCULAR | Status: AC
  Administered 2019-04-19: 20 mg via INTRAVENOUS
  Filled 2019-04-19: qty 2

## 2019-04-19 MED ORDER — SODIUM CHLORIDE 0.9 % WEIGHT BASED INFUSION
0.5000 mL/kg/h | INTRAVENOUS | Status: DC
  Administered 2019-04-19: 0.5 mL/kg/h via INTRAVENOUS

## 2019-04-19 MED ORDER — HYDROCHLOROTHIAZIDE 25 MG PO TABS
12.5000 mg | ORAL_TABLET | Freq: Every day | ORAL | Status: DC
  Administered 2019-04-19 – 2019-04-20 (×2): 12.5 mg via ORAL
  Filled 2019-04-19 (×2): qty 1

## 2019-04-19 MED ORDER — NITROGLYCERIN 0.4 MG SL SUBL: 0.4000 mg | SUBLINGUAL_TABLET | SUBLINGUAL | Status: DC | PRN

## 2019-04-19 MED ORDER — NITROGLYCERIN 2 % TD OINT
1.0000 [in_us] | TOPICAL_OINTMENT | Freq: Once | TRANSDERMAL | Status: AC
  Administered 2019-04-19: 1 [in_us] via TOPICAL
  Filled 2019-04-19: qty 1

## 2019-04-19 MED ORDER — ONDANSETRON HCL 4 MG/2ML IJ SOLN: 4.0000 mg | Freq: Four times a day (QID) | INTRAMUSCULAR | Status: DC | PRN

## 2019-04-19 MED ORDER — ASPIRIN 81 MG PO CHEW
324.0000 mg | CHEWABLE_TABLET | Freq: Once | ORAL | Status: AC
  Administered 2019-04-19: 324 mg via ORAL
  Filled 2019-04-19: qty 4

## 2019-04-19 MED ORDER — SERTRALINE HCL 50 MG PO TABS
25.0000 mg | ORAL_TABLET | Freq: Every day | ORAL | Status: DC
  Administered 2019-04-19 – 2019-04-20 (×2): 25 mg via ORAL
  Filled 2019-04-19 (×2): qty 1

## 2019-04-19 MED ORDER — ATORVASTATIN CALCIUM 80 MG PO TABS
80.0000 mg | ORAL_TABLET | Freq: Every day | ORAL | Status: DC
  Administered 2019-04-19: 80 mg via ORAL
  Filled 2019-04-19: qty 1

## 2019-04-19 MED ORDER — NITROGLYCERIN 0.4 MG SL SUBL
0.4000 mg | SUBLINGUAL_TABLET | SUBLINGUAL | Status: AC | PRN
  Administered 2019-04-19 (×3): 0.4 mg via SUBLINGUAL
  Filled 2019-04-19: qty 1

## 2019-04-19 MED ORDER — POTASSIUM CHLORIDE ER 10 MEQ PO TBCR
40.0000 meq | EXTENDED_RELEASE_TABLET | Freq: Two times a day (BID) | ORAL | Status: AC
  Administered 2019-04-19: 40 meq via ORAL
  Filled 2019-04-19 (×3): qty 4

## 2019-04-19 MED ORDER — TICAGRELOR 90 MG PO TABS
90.0000 mg | ORAL_TABLET | Freq: Two times a day (BID) | ORAL | Status: DC
  Administered 2019-04-19 – 2019-04-20 (×2): 90 mg via ORAL
  Filled 2019-04-19 (×2): qty 1

## 2019-04-19 MED ORDER — LIRAGLUTIDE -WEIGHT MANAGEMENT 18 MG/3ML ~~LOC~~ SOPN: 3.0000 mg | PEN_INJECTOR | Freq: Every day | SUBCUTANEOUS | Status: DC

## 2019-04-19 MED ORDER — POTASSIUM CHLORIDE 20 MEQ/15ML (10%) PO SOLN
20.0000 meq | Freq: Once | ORAL | Status: AC
  Administered 2019-04-19: 20 meq via ORAL
  Filled 2019-04-19: qty 15

## 2019-04-19 MED ORDER — NITROGLYCERIN IN D5W 200-5 MCG/ML-% IV SOLN: 2.0000 ug/min | INTRAVENOUS | Status: DC

## 2019-04-19 MED ORDER — METOPROLOL TARTRATE 12.5 MG HALF TABLET
12.5000 mg | ORAL_TABLET | Freq: Two times a day (BID) | ORAL | Status: DC
  Administered 2019-04-19 – 2019-04-20 (×3): 12.5 mg via ORAL
  Filled 2019-04-19 (×3): qty 1

## 2019-04-19 NOTE — ED Notes (Signed)
Dr. Florina Ou aware  troponin is 2.1

## 2019-04-19 NOTE — ED Notes (Signed)
Dr. Florina Ou aware that K level is 2.7

## 2019-04-19 NOTE — ED Notes (Signed)
[facility] at bedside 

## 2019-04-19 NOTE — ED Provider Notes (Signed)
[facility] Provider Note: Georgena Spurling, MD, FACEP  CSN: [id number] MRN: [id number] ARRIVAL: 04/18/19 at [place]: [id number]   CHIEF COMPLAINT  Chest Pain   HISTORY OF PRESENT ILLNESS  04/19/19 12:10 AM Lauren Carr is a 43 y.o. female who had the fairly sudden onset of a rapid heartbeat with lightheadedness about an hour prior to arrival.  The palpitations have resolved but she continues to have a sense of tightness in her chest.  This tightness is described as an 8 out of 10 at its worst and about a 5 out of 10 now.  She has some mild shortness of breath and a sensation that she is having difficulty pulling in a deep breath.  She denies nausea, vomiting or diaphoresis.  She states she has a history of a heart murmur, followed by Dr. Einar Gip.  She denies leg pain or swelling.  She denies recent travel.  She has been diagnosed with obstructive sleep apnea but is not currently on CPAP or other therapy for this.   Past Medical History:  Diagnosis Date  . Hypertension   . Obstructive sleep apnea     Past Surgical History:  Procedure Laterality Date  . CESAREAN SECTION    . CHOLECYSTECTOMY      Family History  Problem Relation Age of Onset  . Breast cancer Mother   . CAD Mother   . Hypertension Mother   . Cancer - Prostate Father   . Cancer - Cervical Maternal Grandmother   . Diabetes Maternal Grandmother     Social History   Tobacco Use  . Smoking status: Never Smoker  . Smokeless tobacco: Never Used  Substance Use Topics  . Alcohol use: Yes    Alcohol/week: 2.0 standard drinks    Types: 2 Glasses of wine per week  . Drug use: No    Prior to Admission medications   Medication Sig Start Date End Date Taking? Authorizing Provider  hydrochlorothiazide (HYDRODIURIL) 12.5 MG tablet Take 1 tablet by mouth once daily Patient taking differently: Take 12.5 mg by mouth every morning.  03/18/19  Yes [name], FNP  sertraline (ZOLOFT) 25 MG tablet  Take 1 tablet (25 mg total) by mouth daily. Patient taking differently: Take 25 mg by mouth at bedtime.  11/24/18  Yes [name], FNP  telmisartan (MICARDIS) 20 MG tablet TAKE 1 TABLET BY MOUTH ONCE DAILY Patient taking differently: Take 20 mg by mouth every morning.  10/31/18  Yes [name], FNP  Insulin Pen Needle (NOVOFINE PLUS) 32G X 4 MM MISC by Does not apply route. Use with saxenda once per day    [provider]  Liraglutide -Weight Management (SAXENDA) 18 MG/3ML SOPN Inject 3 mg into the skin daily. Inject 3mg  by subcutaneous route every day in the abdomen, thigh or upper arm Patient not taking: Reported on 04/19/2019 11/24/18   [name], FNP    Allergies Patient has no known allergies.   REVIEW OF SYSTEMS  Negative except as noted here or in the History of Present Illness.   PHYSICAL EXAMINATION  Initial Vital Signs Blood pressure 130/79, pulse (!) 105, temperature 98.4 F (36.9 C), temperature source Oral, resp. rate 14, height 5\' 9"  (1.753 m), weight 105.7 kg, SpO2 100 %.  Examination General: Well-developed, well-nourished female in no acute distress; appearance consistent with age of record HENT: normocephalic; atraumatic Eyes: pupils equal, round and reactive to light; extraocular muscles intact Neck: supple Heart: regular rate and rhythm; no murmur  heard Lungs: clear to auscultation bilaterally but mildly decreased air movement bilaterally Abdomen: soft; nondistended; nontender; bowel sounds present Extremities: No deformity; full range of motion; pulses normal Neurologic: Awake, alert and oriented; motor function intact in all extremities and symmetric; no facial droop Skin: Warm and dry Psychiatric: Normal mood and affect   RESULTS  Summary of this visit's results, reviewed by myself:   EKG Interpretation  Date/Time:  Saturday April 18 2019 23:58:29 EDT Ventricular Rate:  108 PR Interval:    QRS Duration: 77 QT Interval:  322 QTC  Calculation: 432 R Axis:   -21 Text Interpretation:  Sinus tachycardia Borderline left axis deviation Low voltage, precordial leads Rate is faster Confirmed by Velencia Lenart [id number]) on 04/19/2019 12:10:04 AM      Laboratory Studies: Results for orders placed or performed during the hospital encounter of 04/18/19 (from the past 24 hour(s))  CBC with Differential/Platelet     Status: None   Collection Time: 04/19/19 12:19 AM  Result Value Ref Range   WBC 8.2 4.0 - 10.5 K/uL   RBC 4.74 3.87 - 5.11 MIL/uL   Hemoglobin 12.7 12.0 - 15.0 g/dL   HCT 39.5 36.0 - 46.0 %   MCV 83.3 80.0 - 100.0 fL   MCH 26.8 26.0 - 34.0 pg   MCHC 32.2 30.0 - 36.0 g/dL   RDW 13.9 11.5 - 15.5 %   Platelets 303 150 - 400 K/uL   nRBC 0.0 0.0 - 0.2 %   Neutrophils Relative % 49 %   Neutro Abs 4.0 1.7 - 7.7 K/uL   Lymphocytes Relative 41 %   Lymphs Abs 3.3 0.7 - 4.0 K/uL   Monocytes Relative 9 %   Monocytes Absolute 0.7 0.1 - 1.0 K/uL   Eosinophils Relative 1 %   Eosinophils Absolute 0.1 0.0 - 0.5 K/uL   Basophils Relative 0 %   Basophils Absolute 0.0 0.0 - 0.1 K/uL   Immature Granulocytes 0 %   Abs Immature Granulocytes 0.01 0.00 - 0.07 K/uL  Basic metabolic panel     Status: Abnormal   Collection Time: 04/19/19 12:19 AM  Result Value Ref Range   Sodium 136 135 - 145 mmol/L   Potassium 2.7 (LL) 3.5 - 5.1 mmol/L   Chloride 103 98 - 111 mmol/L   CO2 20 (L) 22 - 32 mmol/L   Glucose, Bld 101 (H) 70 - 99 mg/dL   BUN 16 6 - 20 mg/dL   Creatinine, Ser 0.86 0.44 - 1.00 mg/dL   Calcium 9.8 8.9 - 10.3 mg/dL   GFR calc non Af Amer >60 >60 mL/min   GFR calc Af Amer >60 >60 mL/min   Anion gap 13 5 - 15  Troponin I - ONCE - STAT     Status: Abnormal   Collection Time: 04/19/19 12:19 AM  Result Value Ref Range   Troponin I 0.11 (HH) <0.03 ng/mL  Troponin I - ONCE - STAT     Status: Abnormal   Collection Time: 04/19/19  3:20 AM  Result Value Ref Range   Troponin I 0.21 (HH) <0.03 ng/mL  HIV antibody (Routine  Testing)     Status: None   Collection Time: 04/19/19  6:12 AM  Result Value Ref Range   HIV Screen 4th Generation wRfx Non Reactive Non Reactive  T4, free     Status: None   Collection Time: 04/19/19  6:12 AM  Result Value Ref Range   Free T4 0.92 0.82 - 1.77 ng/dL  TSH  Status: None   Collection Time: 04/19/19  6:12 AM  Result Value Ref Range   TSH 1.892 0.350 - 4.500 uIU/mL  Brain natriuretic peptide     Status: None   Collection Time: 04/19/19  6:12 AM  Result Value Ref Range   B Natriuretic Peptide 34.2 0.0 - 100.0 pg/mL  Heparin level (unfractionated)     Status: Abnormal   Collection Time: 04/19/19  9:19 AM  Result Value Ref Range   Heparin Unfractionated >2.20 (H) 0.30 - 0.70 IU/mL  Lipid panel     Status: Abnormal   Collection Time: 04/19/19  9:19 AM  Result Value Ref Range   Cholesterol 203 (H) 0 - 200 mg/dL   Triglycerides 125 <150 mg/dL   HDL 53 >40 mg/dL   Total CHOL/HDL Ratio 3.8 RATIO   VLDL 25 0 - 40 mg/dL   LDL Cholesterol 125 (H) 0 - 99 mg/dL  Heparin level (unfractionated)     Status: None   Collection Time: 04/19/19 11:30 AM  Result Value Ref Range   Heparin Unfractionated 0.65 0.30 - 0.70 IU/mL   Imaging Studies: Dg Chest 2 View  Result Date: 04/19/2019 CLINICAL DATA:  Chest pain EXAM: CHEST - 2 VIEW COMPARISON:  04/11/2015 FINDINGS: The heart size and mediastinal contours are within normal limits. Both lungs are clear. The visualized skeletal structures are unremarkable. IMPRESSION: No active cardiopulmonary disease. Electronically Signed   By: Ulyses Jarred M.D.   On: 04/19/2019 00:54    ED COURSE and MDM  Nursing notes and initial vitals signs, including pulse oximetry, reviewed.  Vitals:   04/19/19 0300 04/19/19 0330 04/19/19 0526 04/19/19 0832  BP: 109/60 (!) 104/58 109/69 104/64  Pulse: 80 77 71 74  Resp:    16  Temp:   97.6 F (36.4 C) 97.8 F (36.6 C)  TempSrc:   Oral Oral  SpO2: 96% 95% 100% 100%  Weight:   107.5 kg   Height:   5'  9" (1.753 m)    12:49 AM Potassium repletion initiated for hypokalemia.  Troponin elevated consistent with non-STEMI.  Aspirin and sublingual nitroglycerin ordered.  2:07 AM She is chest discomfort down to 2 out of 10 after sublingual nitroglycerin x3.  Heparin ordered.  Will have patient admitted for non-STEMI.  2:19 AM Dr. Raiford Simmonds accepts for admission to the [facility] cardiology [facility].  PROCEDURES   CRITICAL CARE Performed by: Karen Chafe Maxie Slovacek Total critical care time: 35 minutes Critical care time was exclusive of separately billable procedures and treating other patients. Critical care was necessary to treat or prevent imminent or life-threatening deterioration. Critical care was time spent personally by me on the following activities: development of treatment plan with patient and/or surrogate as well as nursing, discussions with consultants, evaluation of patient's response to treatment, examination of patient, obtaining history from patient or surrogate, ordering and performing treatments and interventions, ordering and review of laboratory studies, ordering and review of radiographic studies, pulse oximetry and re-evaluation of patient's condition.   ED DIAGNOSES     ICD-10-CM   1. NSTEMI (non-ST elevated myocardial infarction) ([place]) I21.4   2. Hypokalemia E87.6        Kylil Swopes, Jenny Reichmann, MD 04/19/19 [phone]

## 2019-04-19 NOTE — ED Notes (Signed)
ED Provider at bedside. 

## 2019-04-19 NOTE — ED Notes (Signed)
Troponin is 0.11. Dr. Florina Ou aware.

## 2019-04-19 NOTE — Progress Notes (Signed)
ANTICOAGULATION CONSULT NOTE   Pharmacy Consult for Heparin  Indication: NSTEMI  No Known Allergies  Patient Measurements: Height: 5\' 9"  (175.3 cm) Weight: 236 lb 14.4 oz (107.5 kg) IBW/kg (Calculated) : 66.2  Vital Signs: Temp: 97.8 F (36.6 C) (04/26 0832) Temp Source: Oral (04/26 0832) BP: 104/64 (04/26 0832) Pulse Rate: 74 (04/26 0832)  Labs: Recent Labs    04/19/19 0019 04/19/19 0320 04/19/19 0919 04/19/19 1130  HGB 12.7  --   --   --   HCT 39.5  --   --   --   PLT 303  --   --   --   HEPARINUNFRC  --   --  >2.20* 0.65  CREATININE 0.86  --   --   --   TROPONINI 0.11* 0.21*  --   --     Estimated Creatinine Clearance: 107.8 mL/min (by C-G formula based on SCr of 0.86 mg/dL).   Medical History: Past Medical History:  Diagnosis Date  . Hypertension   . Obstructive sleep apnea    Assessment: 43 y/o F transfer from [facility] with NSTEMI. Starting heparin. CBC/renal function good. PTA meds reviewed.   Initial heparin level higher than expected at >2.2,  Confirmed with RN and patient that lab collected from arm opposite of where heparin was running.  Asked RN to hold heparin.  Level then rechecked about 10 min later and was 0.65.  No overt bleeding or complications noted.  Goal of Therapy:  Heparin level 0.3-0.7 units/ml Monitor platelets by anticoagulation protocol: Yes   Plan:  Decrease IV heparin to 800 units/hr Recheck heparin level in 6 hrs Daily heparin level and CBC. F/u plans for heparin after cath lab tomorrow.  Marguerite Olea, [facility] Clinical Pharmacist Phone [phone]  04/19/2019 1:06 PM

## 2019-04-19 NOTE — ED Notes (Signed)
Jeneen Rinks at [place] notified to verify heparin

## 2019-04-19 NOTE — H&P (Signed)
Patient ID: Lauren Carr MRN: [id number], DOB/AGE: 1976-02-13   Admit date: 04/18/2019  Primary Physician: [name], [place] Primary Cardiologist: No primary care provider on file.   Past Medical History   Past Medical History:  Diagnosis Date  . Hypertension   . Obstructive sleep apnea     Past Surgical History:  Procedure Laterality Date  . CESAREAN SECTION    . CHOLECYSTECTOMY       Allergies  No Known Allergies  History of Present Illness    Lauren Carr is a 43 y.o. female history of sleep apnea who had the fairly sudden onset of a rapid heartbeat with lightheadedness.  The palpitations have resolved but she continues to have a sense of tightness in her chest.  This tightness is described as an 8 out of 10 at its worst and about a 5 out of 10 now.  Have it pain like that before.  Denies any exertional's of breath, lower extreme edema PND orthopnea in the last weeks.  Denies any new medications.  No new stressors.  Positive illicit drug use, no smoking or alcohol.  She states she has a history of a heart murmur, followed by Dr. Einar Gip.     Family History    Family History  Problem Relation Age of Onset  . Breast cancer Mother   . CAD Mother   . Hypertension Mother   . Cancer - Prostate Father   . Cancer - Cervical Maternal Grandmother   . Diabetes Maternal Grandmother    She indicated that the status of her mother is unknown. She indicated that the status of her father is unknown. She indicated that the status of her maternal grandmother is unknown.   Social History    Social History   Socioeconomic History  . Marital status: Divorced    Spouse name: Not on file  . Number of children: Not on file  . Years of education: Not on file  . Highest education level: Not on file  Occupational History  . Not on file  Social Needs  . Financial resource strain: Not on file  . Food insecurity:    Worry: Not on file    Inability: Not on file  .  Transportation needs:    Medical: Not on file    Non-medical: Not on file  Tobacco Use  . Smoking status: Never Smoker  . Smokeless tobacco: Never Used  Substance and Sexual Activity  . Alcohol use: Yes    Alcohol/week: 2.0 standard drinks    Types: 2 Glasses of wine per week  . Drug use: No  . Sexual activity: Not on file  Lifestyle  . Physical activity:    Days per week: Not on file    Minutes per session: Not on file  . Stress: Not on file  Relationships  . Social connections:    Talks on phone: Not on file    Gets together: Not on file    Attends religious service: Not on file    Active member of club or organization: Not on file    Attends meetings of clubs or organizations: Not on file    Relationship status: Not on file  . Intimate partner violence:    Fear of current or ex partner: Not on file    Emotionally abused: Not on file    Physically abused: Not on file    Forced sexual activity: Not on file  Other Topics Concern  . Not on file  Social History Narrative  . Not on file     Review of Systems    General:  No chills, fever, night sweats or weight changes.  Cardiovascular:  No chest pain, dyspnea on exertion, edema, orthopnea, palpitations, paroxysmal nocturnal dyspnea. Dermatological: No rash, lesions/masses Respiratory: No cough, dyspnea Urologic: No hematuria, dysuria Abdominal:   No nausea, vomiting, diarrhea, bright red blood per rectum, melena, or hematemesis Neurologic:  No visual changes, wkns, changes in mental status. All other systems reviewed and are otherwise negative except as noted above.  Physical Exam    Blood pressure 108/64, pulse 80, temperature 98.4 F (36.9 C), temperature source Oral, resp. rate 13, height 5\' 9"  (1.753 m), weight 105.7 kg, last menstrual period 04/10/2019, SpO2 100 %.  General: In pain Psych: Normal affect. Neuro: Alert and oriented X 3. Moves all extremities spontaneously. HEENT: Normal  Neck: Supple without  bruits or JVD. Lungs:  Resp regular and unlabored, CTA. Heart: RRR no s3, s4, or murmurs. Abdomen: Soft, non-tender, non-distended, BS + x 4.  Extremities: No clubbing, cyanosis or edema. DP/PT/Radials 2+ and equal bilaterally.  Labs    Troponin (Point of Care Test) No results for input(s): TROPIPOC in the last 72 hours. Recent Labs    04/19/19 0019  TROPONINI 0.11*   Lab Results  Component Value Date   WBC 8.2 04/19/2019   HGB 12.7 04/19/2019   HCT 39.5 04/19/2019   MCV 83.3 04/19/2019   PLT 303 04/19/2019    Recent Labs  Lab 04/19/19 0019  NA 136  K 2.7*  CL 103  CO2 20*  BUN 16  CREATININE 0.86  CALCIUM 9.8  GLUCOSE 101*   Lab Results  Component Value Date   CHOL 216 (A) 07/24/2018   HDL 61 07/24/2018   LDLCALC 124 07/24/2018   TRIG 154 07/24/2018   No results found for: [facility]   Radiology Studies    Dg Chest 2 View  Result Date: 04/19/2019 CLINICAL DATA:  Chest pain EXAM: CHEST - 2 VIEW COMPARISON:  04/11/2015 FINDINGS: The heart size and mediastinal contours are within normal limits. Both lungs are clear. The visualized skeletal structures are unremarkable. IMPRESSION: No active cardiopulmonary disease. Electronically Signed   By: Ulyses Jarred M.D.   On: 04/19/2019 00:54    ECG & Cardiac Imaging    Sinus tach with ST depression infero-aterally   Assessment & Plan    NSTEMI. Risk factors are family history. ECG with ischemic changes. Has ongoing CP.   Plan: - Admit to cardiology - Nitro drip - Atorva 80, Heparin drip, ASA - LHC on Monday and Echo   Signed, Cristina Gong, MD 04/19/2019, 2:50 AM  For questions or updates, please contact   Please consult [email or web address] for contact info under Cardiology/STEMI.

## 2019-04-19 NOTE — Progress Notes (Signed)
ANTICOAGULATION CONSULT NOTE - Initial Consult  Pharmacy Consult for Heparin  Indication: NSTEMI  No Known Allergies  Patient Measurements: Height: 5\' 9"  (175.3 cm) Weight: 233 lb (105.7 kg) IBW/kg (Calculated) : 66.2  Vital Signs: Temp: 98.4 F (36.9 C) (04/25 2359) Temp Source: Oral (04/25 2359) BP: 104/58 (04/26 0330) Pulse Rate: 77 (04/26 0330)  Labs: Recent Labs    04/19/19 0019 04/19/19 0320  HGB 12.7  --   HCT 39.5  --   PLT 303  --   CREATININE 0.86  --   TROPONINI 0.11* 0.21*    Estimated Creatinine Clearance: 106.9 mL/min (by C-G formula based on SCr of 0.86 mg/dL).   Medical History: Past Medical History:  Diagnosis Date  . Hypertension   . Obstructive sleep apnea    Assessment: 43 y/o F transfer from [facility] with NSTEMI. Starting heparin. CBC/renal function good. PTA meds reviewed.   Goal of Therapy:  Heparin level 0.3-0.7 units/ml Monitor platelets by anticoagulation protocol: Yes   Plan:  Heparin 4000 units BOLUS Start heparin drip at 1100 units/hr 1000 HL Daily CBC/HL Monitor for bleeding   Narda Bonds 04/19/2019,4:06 AM

## 2019-04-19 NOTE — H&P (Signed)
CARDIOLOGY ADMIT NOTE   Lauren Carr is an 43 y.o. female.    Chief Complaint  Patient presents with  . Chest Pain      HPI: Lauren Carr  is a 43 y.o. female  with hypertension, mild obstructive sleep apnea diagnosed sometime in August 2019, anxiety, obesity presenting with sudden onset of chest tightness that started around 9 PM yesterday at home while she was doing her dishes.  Chest pain was associated with marked dyspnea and also palpitations and elevated heart rate.  Described as tightness in the middle of the chest with no radiation.  As it did not subside, eventually presented to the emergency room where she was found to have abnormal EKG and elevated serum troponin and hence admitted for further management and evaluation.  She was started on IV heparin and nitroglycerin paste and started on low-dose beta-blocker along with statin therapy.  Presently doing well and states that she still has shortness of breath and although has chest pain states that it is much improved and very minimal.  She still feels orthopneic when she lays down.  She has not purchased a CPAP yet.  Past Medical History:  Diagnosis Date  . Hypertension   . Obstructive sleep apnea     Past Surgical History:  Procedure Laterality Date  . CESAREAN SECTION    . CHOLECYSTECTOMY      Social History   Socioeconomic History  . Marital status: Divorced    Spouse name: Not on file  . Number of children: Not on file  . Years of education: Not on file  . Highest education level: Not on file  Occupational History  . Not on file  Social Needs  . Financial resource strain: Not on file  . Food insecurity:    Worry: Not on file    Inability: Not on file  . Transportation needs:    Medical: Not on file    Non-medical: Not on file  Tobacco Use  . Smoking status: Never Smoker  . Smokeless tobacco: Never Used  Substance and Sexual Activity  . Alcohol use: Yes    Alcohol/week: 2.0 standard drinks     Types: 2 Glasses of wine per week  . Drug use: No  . Sexual activity: Not on file  Lifestyle  . Physical activity:    Days per week: Not on file    Minutes per session: Not on file  . Stress: Not on file  Relationships  . Social connections:    Talks on phone: Not on file    Gets together: Not on file    Attends religious service: Not on file    Active member of club or organization: Not on file    Attends meetings of clubs or organizations: Not on file    Relationship status: Not on file  . Intimate partner violence:    Fear of current or ex partner: Not on file    Emotionally abused: Not on file    Physically abused: Not on file    Forced sexual activity: Not on file  Other Topics Concern  . Not on file  Social History Narrative  . Not on file    No current facility-administered medications on file prior to encounter.    Current Outpatient Medications on File Prior to Encounter  Medication Sig Dispense Refill  . hydrochlorothiazide (HYDRODIURIL) 12.5 MG tablet Take 1 tablet by mouth once daily 30 tablet 0  . sertraline (ZOLOFT) 25 MG tablet Take  1 tablet (25 mg total) by mouth daily. 90 tablet 1  . ergocalciferol (VITAMIN D2) 1.25 MG (50000 UT) capsule Take 50,000 Units by mouth once a week.    . Insulin Pen Needle (NOVOFINE PLUS) 32G X 4 MM MISC by Does not apply route. Use with saxenda once per day    . Liraglutide -Weight Management (SAXENDA) 18 MG/3ML SOPN Inject 3 mg into the skin daily. Inject 3mg  by subcutaneous route every day in the abdomen, thigh or upper arm (Patient not taking: Reported on 04/19/2019) 5 pen 1  . metroNIDAZOLE (FLAGYL) 500 MG tablet Take 1 tablet (500 mg total) by mouth 2 (two) times daily. 14 tablet 0  . telmisartan (MICARDIS) 20 MG tablet TAKE 1 TABLET BY MOUTH ONCE DAILY 90 tablet 1   Scheduled Meds: . [START ON 04/20/2019] aspirin EC  81 mg Oral Daily  . atorvastatin  80 mg Oral q1800  . hydrochlorothiazide  12.5 mg Oral Daily  . irbesartan   75 mg Oral Daily  . metoprolol tartrate  12.5 mg Oral BID  . sertraline  25 mg Oral Daily   Continuous Infusions: . heparin Stopped (04/19/19 1117)  . nitroGLYCERIN Stopped (04/19/19 0622)   PRN Meds:.acetaminophen, nitroGLYCERIN, ondansetron (ZOFRAN) IV   Review of Systems  Constitution: Negative for chills, decreased appetite, malaise/fatigue and weight gain.  Cardiovascular: Positive for chest pain and dyspnea on exertion. Negative for leg swelling and syncope.  Respiratory: Positive for sleep disturbances due to breathing (has mild OSA not on CPAP).   Endocrine: Negative for cold intolerance.  Hematologic/Lymphatic: Does not bruise/bleed easily.  Musculoskeletal: Negative for joint swelling.  Gastrointestinal: Negative for abdominal pain, anorexia and change in bowel habit.  Neurological: Positive for excessive daytime sleepiness. Negative for headaches and light-headedness.  Psychiatric/Behavioral: Negative for depression and substance abuse. The patient is nervous/anxious.   All other systems reviewed and are negative.      Objective:  Blood pressure 104/64, pulse 74, temperature 97.8 F (36.6 C), temperature source Oral, resp. rate 16, height 5\' 9"  (1.753 m), weight 107.5 kg, last menstrual period 04/10/2019, SpO2 100 %. Body mass index is 34.98 kg/m.  Physical Exam  Constitutional: She appears well-developed. No distress.  Moderately obese  HENT:  Head: Atraumatic.  Eyes: Conjunctivae are normal.  Neck: Neck supple. No JVD present. No thyromegaly present.  Cardiovascular: Normal rate, regular rhythm, normal heart sounds and intact distal pulses. Exam reveals no gallop.  No murmur heard. Pulmonary/Chest: Effort normal and breath sounds normal.  Abdominal: Soft. Bowel sounds are normal.  Musculoskeletal: Normal range of motion.        General: No edema.  Neurological: She is alert.  Skin: Skin is warm and dry.  Psychiatric: She has a normal mood and affect.     Radiology: Dg Chest 2 View  Result Date: 04/19/2019 CLINICAL DATA:  Chest pain EXAM: CHEST - 2 VIEW COMPARISON:  04/11/2015 FINDINGS: The heart size and mediastinal contours are within normal limits. Both lungs are clear. The visualized skeletal structures are unremarkable. IMPRESSION: No active cardiopulmonary disease. Electronically Signed   By: Ulyses Jarred M.D.   On: 04/19/2019 00:54    Laboratory Examination:  CMP Latest Ref Rng & Units 04/19/2019 07/24/2018 09/11/2009  Glucose 70 - 99 mg/dL 101(H) - 82  BUN 6 - 20 mg/dL 16 14 11   Creatinine 0.44 - 1.00 mg/dL 0.86 0.9 0.72  Sodium 135 - 145 mmol/L 136 136(A) 135  Potassium 3.5 - 5.1 mmol/L 2.7(LL) 4.1 3.8  Chloride 98 - 111 mmol/L 103 - 103  CO2 22 - 32 mmol/L 20(L) - 24  Calcium 8.9 - 10.3 mg/dL 9.8 - 9.3  Alkaline Phos 25 - 125 - 55 -  AST 13 - 35 - 34 -  ALT 7 - 35 - 39(A) -   CBC Latest Ref Rng & Units 04/19/2019 07/24/2018 09/11/2009  WBC 4.0 - 10.5 K/uL 8.2 4.2 6.2  Hemoglobin 12.0 - 15.0 g/dL 12.7 12.1 12.7  Hematocrit 36.0 - 46.0 % 39.5 38 38.1  Platelets 150 - 400 K/uL 303 298 255   Lipid Panel     Component Value Date/Time   CHOL 203 (H) 04/19/2019 0919   TRIG 125 04/19/2019 0919   HDL 53 04/19/2019 0919   CHOLHDL 3.8 04/19/2019 0919   VLDL 25 04/19/2019 0919   LDLCALC 125 (H) 04/19/2019 0919   HEMOGLOBIN A1C Lab Results  Component Value Date   HGBA1C 5.3 07/24/2018   TSH Recent Labs    07/24/18 0900 04/19/19 0612  TSH 0.93 1.892   Cardiac Panel (last 3 results) Recent Labs    04/19/19 0019 04/19/19 0320  TROPONINI 0.11* 0.21*   Cardiac studies:   Echo  ordered  Assessment:   1.  Non-STEMI presenting with chest pain and shortness of breath. EKG 04/19/2019: Normal sinus rhythm at a rate of 83 bpm. Poor R wave progression, cannot exclude anteroseptal infarct old.  inferior ischemia.  Compared to yesterday's EKG, T wave inversion much more pronounced in the inferior leads. 2.   Hyperlipidemia 3.  Mild to moderate obesity 4.  Essential hypertension  Plan:  Patient with chest discomfort, symptoms of chest pain has improved, still has dyspnea.  Lungs are clear, no clinical evidence of congestive heart failure, but she does have orthopnea, I will give her 1 dose of furosemide IV.  I will start the patient on Brilinta along with continued aspirin and also have reviewed the admission notes and agree with the management including continued IV heparin and high-dose high intensity statins.  Schedule for cardiac catheterization, and possible angioplasty. We discussed regarding risks, benefits, alternatives to this including stress testing, CTA and continued medical therapy. Patient wants to proceed. Understands <1-2% risk of death, stroke, MI, urgent CABG, bleeding, infection, renal failure but not limited to these.  I will set this up for the morning.  Adrian Prows, MD, [facility] 04/19/2019, 11:44 AM [facility] [place] Pager: [phone] Office: [phone] If no answer Cell [phone]

## 2019-04-19 NOTE — Progress Notes (Signed)
ANTICOAGULATION CONSULT NOTE   Pharmacy Consult for Heparin  Indication: NSTEMI  No Known Allergies  Patient Measurements: Height: 5\' 9"  (175.3 cm) Weight: 236 lb 14.4 oz (107.5 kg) IBW/kg (Calculated) : 66.2  Vital Signs: Temp: 98.2 F (36.8 C) (04/26 2005) Temp Source: Oral (04/26 2005) BP: 108/63 (04/26 2005) Pulse Rate: 72 (04/26 2005)  Labs: Recent Labs    04/19/19 0019 04/19/19 0320 04/19/19 0919 04/19/19 1130 04/19/19 1942  HGB 12.7  --   --   --   --   HCT 39.5  --   --   --   --   PLT 303  --   --   --   --   HEPARINUNFRC  --   --  >2.20* 0.65 0.32  CREATININE 0.86  --   --   --   --   TROPONINI 0.11* 0.21*  --   --   --     Estimated Creatinine Clearance: 107.8 mL/min (by C-G formula based on SCr of 0.86 mg/dL).   Medical History: Past Medical History:  Diagnosis Date  . Hypertension   . Obstructive sleep apnea    Assessment: 43 y/o F transfer from [facility] with NSTEMI. Starting heparin. CBC/renal function good. PTA meds reviewed.   Heparin level came back therapeutic at 0.32, on 800 units/hr. No s/sx of bleeding. No infusion issues.   Goal of Therapy:  Heparin level 0.3-0.7 units/ml Monitor platelets by anticoagulation protocol: Yes   Plan:  Increase IV heparin to 900 units/hr to keep in goal range Daily heparin level and CBC. F/u plans for heparin after cath lab tomorrow.  Antonietta Jewel, PharmD, [facility] Clinical Pharmacist  Pager: [phone] Phone: [phone]  04/19/2019 8:39 PM

## 2019-04-19 NOTE — ED Notes (Signed)
Patient is pain free at this time, BP within goal as specified in nitro gtt order. Will consult cards to see if nitro gtt is still needed. Pt sleeping at this time. Awaiting carelink for transfer to [facility].

## 2019-04-20 ENCOUNTER — Encounter (HOSPITAL_COMMUNITY)
Admission: EM | Disposition: A | Discharge status: Home / Self Care | Payer: Self-pay | Source: Home / Self Care | Attending: Cardiology

## 2019-04-20 ENCOUNTER — Encounter (HOSPITAL_COMMUNITY): Payer: Self-pay | Admitting: Cardiology

## 2019-04-20 HISTORY — PX: LEFT HEART CATH AND CORONARY ANGIOGRAPHY: CATH118249

## 2019-04-20 LAB — BASIC METABOLIC PANEL
Anion gap: 11 (ref 5–15)
BUN: 11 mg/dL (ref 6–20)
CO2: 25 mmol/L (ref 22–32)
Calcium: 9.3 mg/dL (ref 8.9–10.3)
Chloride: 101 mmol/L (ref 98–111)
Creatinine, Ser: 0.92 mg/dL (ref 0.44–1.00)
GFR calc Af Amer: >60 mL/min (ref >60)
GFR calc non Af Amer: >60 mL/min (ref >60)
Glucose, Bld: 111 mg/dL — ABNORMAL HIGH (ref 70–99)
Potassium: 3.5 mmol/L (ref 3.5–5.1)
Sodium: 137 mmol/L (ref 135–145)

## 2019-04-20 LAB — CBC
HCT: 34.6 % — ABNORMAL LOW (ref 36.0–46.0)
Hemoglobin: 11.4 g/dL — ABNORMAL LOW (ref 12.0–15.0)
MCH: 27.1 pg (ref 26.0–34.0)
MCHC: 32.9 g/dL (ref 30.0–36.0)
MCV: 82.4 fL (ref 80.0–100.0)
Platelets: 267 10*3/uL (ref 150–400)
RBC: 4.2 MIL/uL (ref 3.87–5.11)
RDW: 14.3 % (ref 11.5–15.5)
WBC: 5.9 10*3/uL (ref 4.0–10.5)
nRBC: 0 % (ref 0.0–0.2)

## 2019-04-20 LAB — HEPARIN LEVEL (UNFRACTIONATED): Heparin Unfractionated: 0.39 IU/mL (ref 0.30–0.70)

## 2019-04-20 SURGERY — LEFT HEART CATH AND CORONARY ANGIOGRAPHY
Anesthesia: LOCAL

## 2019-04-20 MED ORDER — HEPARIN SODIUM (PORCINE) 1000 UNIT/ML IJ SOLN
INTRAMUSCULAR | Status: DC | PRN
  Administered 2019-04-20: 5000 [IU] via INTRAVENOUS

## 2019-04-20 MED ORDER — VERAPAMIL HCL 2.5 MG/ML IV SOLN
INTRAVENOUS | Status: AC
  Filled 2019-04-20: qty 2

## 2019-04-20 MED ORDER — ONDANSETRON HCL 4 MG/2ML IJ SOLN: 4.0000 mg | Freq: Four times a day (QID) | INTRAMUSCULAR | Status: DC | PRN

## 2019-04-20 MED ORDER — SODIUM CHLORIDE 0.9% FLUSH: 3.0000 mL | INTRAVENOUS | Status: DC | PRN

## 2019-04-20 MED ORDER — FUROSEMIDE 10 MG/ML IJ SOLN
20.0000 mg | Freq: Once | INTRAMUSCULAR | Status: AC
  Administered 2019-04-20: 20 mg via INTRAVENOUS
  Filled 2019-04-20: qty 2

## 2019-04-20 MED ORDER — TICAGRELOR 90 MG PO TABS: 90.0000 mg | ORAL_TABLET | Freq: Two times a day (BID) | ORAL | 2 refills | Status: DC

## 2019-04-20 MED ORDER — LIDOCAINE HCL (PF) 1 % IJ SOLN
INTRAMUSCULAR | Status: AC
  Filled 2019-04-20: qty 30

## 2019-04-20 MED ORDER — HYDRALAZINE HCL 20 MG/ML IJ SOLN: 10.0000 mg | INTRAMUSCULAR | Status: AC | PRN

## 2019-04-20 MED ORDER — HEPARIN (PORCINE) IN NACL 1000-0.9 UT/500ML-% IV SOLN
INTRAVENOUS | Status: DC | PRN
  Administered 2019-04-20 (×2): 500 mL

## 2019-04-20 MED ORDER — LABETALOL HCL 5 MG/ML IV SOLN: 10.0000 mg | INTRAVENOUS | Status: AC | PRN

## 2019-04-20 MED ORDER — VERAPAMIL HCL 2.5 MG/ML IV SOLN
INTRAVENOUS | Status: DC | PRN
  Administered 2019-04-20: 08:00:00 10 mL via INTRA_ARTERIAL

## 2019-04-20 MED ORDER — NITROGLYCERIN 0.4 MG SL SUBL: 0.4000 mg | SUBLINGUAL_TABLET | SUBLINGUAL | 2 refills | Status: DC | PRN

## 2019-04-20 MED ORDER — SODIUM CHLORIDE 0.9 % IV SOLN: 250.0000 mL | INTRAVENOUS | Status: DC | PRN

## 2019-04-20 MED ORDER — MIDAZOLAM HCL 2 MG/2ML IJ SOLN
INTRAMUSCULAR | Status: AC
  Filled 2019-04-20: qty 2

## 2019-04-20 MED ORDER — ACETAMINOPHEN 325 MG PO TABS: 650.0000 mg | ORAL_TABLET | ORAL | Status: DC | PRN

## 2019-04-20 MED ORDER — HEPARIN (PORCINE) IN NACL 1000-0.9 UT/500ML-% IV SOLN
INTRAVENOUS | Status: AC
  Filled 2019-04-20: qty 1000

## 2019-04-20 MED ORDER — ATORVASTATIN CALCIUM 80 MG PO TABS: 80.0000 mg | ORAL_TABLET | Freq: Every day | ORAL | 3 refills | Status: DC

## 2019-04-20 MED ORDER — FENTANYL CITRATE (PF) 100 MCG/2ML IJ SOLN
INTRAMUSCULAR | Status: DC | PRN
  Administered 2019-04-20 (×2): 25 ug via INTRAVENOUS

## 2019-04-20 MED ORDER — METOPROLOL TARTRATE 25 MG PO TABS: 12.5000 mg | ORAL_TABLET | Freq: Two times a day (BID) | ORAL | 2 refills | Status: DC

## 2019-04-20 MED ORDER — FENTANYL CITRATE (PF) 100 MCG/2ML IJ SOLN
INTRAMUSCULAR | Status: AC
  Filled 2019-04-20: qty 2

## 2019-04-20 MED ORDER — SODIUM CHLORIDE 0.9 % IV SOLN: INTRAVENOUS | Status: AC

## 2019-04-20 MED ORDER — HEPARIN SODIUM (PORCINE) 1000 UNIT/ML IJ SOLN
INTRAMUSCULAR | Status: AC
  Filled 2019-04-20: qty 1

## 2019-04-20 MED ORDER — MIDAZOLAM HCL 2 MG/2ML IJ SOLN
INTRAMUSCULAR | Status: DC | PRN
  Administered 2019-04-20 (×2): 1 mg via INTRAVENOUS

## 2019-04-20 MED ORDER — LIDOCAINE HCL (PF) 1 % IJ SOLN
INTRAMUSCULAR | Status: DC | PRN
  Administered 2019-04-20: 2 mL

## 2019-04-20 MED ORDER — ASPIRIN 81 MG PO TBEC: 81.0000 mg | DELAYED_RELEASE_TABLET | Freq: Every day | ORAL | 2 refills | Status: DC

## 2019-04-20 MED ORDER — SODIUM CHLORIDE 0.9% FLUSH: 3.0000 mL | Freq: Two times a day (BID) | INTRAVENOUS | Status: DC

## 2019-04-20 MED ORDER — IOHEXOL 350 MG/ML SOLN
INTRAVENOUS | Status: DC | PRN
  Administered 2019-04-20: 25 mL via INTRA_ARTERIAL

## 2019-04-20 MED FILL — ASPIRIN LOW DOSE 81 MG TBEC: 81 | 60 days supply | Qty: 60 | Fill #0 | Status: TO

## 2019-04-20 MED FILL — NITROGLYCERIN 0.4 MG TAB SL: 0.4 | 8 days supply | Qty: 25 | Fill #0 | Status: TO

## 2019-04-20 MED FILL — ATORVASTATIN CALCIUM 80 MG: 80 | 30 days supply | Qty: 30 | Fill #0 | Status: TO

## 2019-04-20 MED FILL — BRILINTA 90 MG TABLET: 90 | 30 days supply | Qty: 60 | Fill #0 | Status: TO

## 2019-04-20 MED FILL — METOPROLOL TARTRATE 25 MG T: 25 | 30 days supply | Qty: 30 | Fill #0 | Status: TO

## 2019-04-20 SURGICAL SUPPLY — 9 items
CATH OPTITORQUE TIG 4.0 5F (CATHETERS) ×2 IMPLANT
DEVICE RAD COMP TR BAND LRG (VASCULAR PRODUCTS) ×2 IMPLANT
GLIDESHEATH SLEND A-KIT 6F 22G (SHEATH) ×2 IMPLANT
GUIDEWIRE INQWIRE 1.5J.035X260 (WIRE) ×1 IMPLANT
INQWIRE 1.5J .035X260CM (WIRE) ×2
KIT HEART LEFT (KITS) ×2 IMPLANT
PACK CARDIAC CATHETERIZATION (CUSTOM PROCEDURE TRAY) ×2 IMPLANT
TRANSDUCER W/STOPCOCK (MISCELLANEOUS) ×2 IMPLANT
TUBING CIL FLEX 10 FLL-RA (TUBING) ×2 IMPLANT

## 2019-04-20 NOTE — Progress Notes (Signed)
Cardiac Rehab Advisory Cardiac Rehab Phase I is not seeing pts face to face at this time due to Covid 19 restrictions. Ambulation is occurring through nursing, PT, and mobility teams. We will help facilitate that process as needed. We are calling pts in their rooms and discussing education. We will then deliver education materials to pts RN for delivery to pt.   [id number] Wrote order for Phase 1 as pt with NSTEMI. Called pt and completed MI education. Discussed brilinta, NTG , MI restrictions, ex ed and heart healthy food choices. Discussed CRP 2 and pt has order for [place] program. Pt voiced understanding of ed. Encouraged pt to start walking for ex. Needs brilinta card. Graylon Good RN BSN 04/20/2019 1:30 PM

## 2019-04-20 NOTE — Discharge Summary (Signed)
Physician Discharge Summary  Patient ID: NHYLA NAPPI MRN: [id number] DOB/AGE: 01/04/1976 43 y.o.  Admit date: 04/18/2019 Discharge date: 04/20/2019  Primary Discharge Diagnosis: NSTEMI  Secondary Discharge Diagnosis: NSTEMI   Hospital Course:   43 y/o [place] American female with hypertension, hyperlipidemia, family h/o long QT syndrome, admitted with chest pain. She was found to have trop elevation to 0.21 ng/mL. Coronary angiography on 04/20/2019 did not show any obstructive CAD. LVEDP was elevated at 22 mmHg.  She is being discharged today on DAPT with Aspirin and Brilinta, along with high intensity statin, and low dose metoprolol. Continue baseline antihypertensive therapy. I have encouraged her to find out more aobut her mother's history of long QT syndrome and whether she underwent genetic testing.   Discharge Exam: Blood pressure 116/67, pulse 67, temperature 98.3 F (36.8 C), temperature source Oral, resp. rate 16, height 5\' 9"  (1.753 m), weight 106.4 kg, last menstrual period 04/10/2019, SpO2 100 %.   Physical Exam  Constitutional: She is oriented to person, place, and time. She appears well-developed and well-nourished. No distress.  HENT:  Head: Normocephalic and atraumatic.  Eyes: Pupils are equal, round, and reactive to light. Conjunctivae are normal.  Neck: No JVD present.  Cardiovascular: Normal rate, regular rhythm and intact distal pulses.  Pulmonary/Chest: Effort normal and breath sounds normal. She has no wheezes. She has no rales.  Abdominal: Soft. Bowel sounds are normal. There is no rebound.  Musculoskeletal:        General: No edema.  Lymphadenopathy:    She has no cervical adenopathy.  Neurological: She is alert and oriented to person, place, and time. No cranial nerve deficit.  Skin: Skin is warm and dry.  Psychiatric: She has a normal mood and affect.  Nursing note and vitals reviewed.     Significant Diagnostic Studies:  EKG  04/20/2019: Sinus rhythm 72 bpm. Possible old anteroseptal infarct  Coronary angiogram 04/20/2019: LM: Normal LAD: Medium caliber. Mid LAD myocardial bridge. Small diagonal branches. No significant stenoses. LCx: Small caliber. Normal RCA: Dominant. No stenoses LVEDP 22 mmHg  Impression: No angiographically evident coronary artery disease  Recommendation: One year DAPT for medical treatment of NSTEMI  Echocardiogram 04/19/2019:  1. Mild hypokinesis of the left ventricular, basal-mid inferolateral wall.  2. The left ventricle has normal systolic function, with an ejection fraction of 60-65%. Left ventricular diastolic function could not be evaluated.  3. The right ventricle has normal systolc function. The cavity was normal.  4. The aortic valve is tricuspid Aortic valve regurgitation is mild by color flow Doppler.  5. The aortic root and ascending aorta are normal in size and structure.   Labs:   Lab Results  Component Value Date   WBC 5.9 04/20/2019   HGB 11.4 (L) 04/20/2019   HCT 34.6 (L) 04/20/2019   MCV 82.4 04/20/2019   PLT 267 04/20/2019    Recent Labs  Lab 04/20/19 0436  NA 137  K 3.5  CL 101  CO2 25  BUN 11  CREATININE 0.92  CALCIUM 9.3  GLUCOSE 111*    Lipid Panel     Component Value Date/Time   CHOL 203 (H) 04/19/2019 0919   TRIG 125 04/19/2019 0919   HDL 53 04/19/2019 0919   CHOLHDL 3.8 04/19/2019 0919   VLDL 25 04/19/2019 0919   LDLCALC 125 (H) 04/19/2019 0919    BNP (last 3 results) Recent Labs    04/19/19 0612  BNP 34.2    HEMOGLOBIN A1C Lab Results  Component  Value Date   HGBA1C 5.3 07/24/2018    Cardiac Panel (last 3 results) Recent Labs    04/19/19 0019 04/19/19 0320  TROPONINI 0.11* 0.21*    Lab Results  Component Value Date   TROPONINI 0.21 (HH) 04/19/2019     TSH Recent Labs    07/24/18 0900 04/19/19 0612  TSH 0.93 1.892    Radiology: Dg Chest 2 View  Result Date: 04/19/2019 CLINICAL DATA:  Chest  pain EXAM: CHEST - 2 VIEW COMPARISON:  04/11/2015 FINDINGS: The heart size and mediastinal contours are within normal limits. Both lungs are clear. The visualized skeletal structures are unremarkable. IMPRESSION: No active cardiopulmonary disease. Electronically Signed   By: Ulyses Jarred M.D.   On: 04/19/2019 00:54      FOLLOW UP PLANS AND APPOINTMENTS Discharge Instructions    AMB Referral to Cardiac Rehabilitation - Phase II   Complete by:  As directed    Diagnosis:  NSTEMI     Allergies as of 04/20/2019   No Known Allergies     Medication List    STOP taking these medications   Liraglutide -Weight Management 18 MG/3ML Sopn Commonly known as:  Saxenda     TAKE these medications   aspirin 81 MG EC tablet Take 1 tablet (81 mg total) by mouth daily. Start taking on:  April 21, 2019   atorvastatin 80 MG tablet Commonly known as:  LIPITOR Take 1 tablet (80 mg total) by mouth daily at 6 PM.   hydrochlorothiazide 12.5 MG tablet Commonly known as:  HYDRODIURIL Take 1 tablet by mouth once daily What changed:  when to take this   metoprolol tartrate 25 MG tablet Commonly known as:  LOPRESSOR Take 0.5 tablets (12.5 mg total) by mouth 2 (two) times daily.   nitroGLYCERIN 0.4 MG SL tablet Commonly known as:  NITROSTAT Place 1 tablet (0.4 mg total) under the tongue every 5 (five) minutes x 3 doses as needed for chest pain.   NovoFine Plus 32G X 4 MM Misc Generic drug:  Insulin Pen Needle by Does not apply route. Use with saxenda once per day   sertraline 25 MG tablet Commonly known as:  ZOLOFT Take 1 tablet (25 mg total) by mouth daily. What changed:  when to take this   telmisartan 20 MG tablet Commonly known as:  MICARDIS TAKE 1 TABLET BY MOUTH ONCE DAILY What changed:  when to take this   ticagrelor 90 MG Tabs tablet Commonly known as:  BRILINTA Take 1 tablet (90 mg total) by mouth 2 (two) times daily.      Follow-up Information    Nigel Mormon, MD  Follow up on 04/29/2019.   Specialties:  Cardiology, Radiology Why:  11:30 AM Contact information: [place] [phone]            Vernell Leep MD, [facility] Pager: [phone] Office: [phone] If no answer: [phone]

## 2019-04-20 NOTE — Interval H&P Note (Signed)
History and Physical Interval Note:  04/20/2019 7:35 AM  Lauren Carr  has presented today for surgery, with the diagnosis of usntable angina.  The various methods of treatment have been discussed with the patient and family. After consideration of risks, benefits and other options for treatment, the patient has consented to  Procedure(s): LEFT HEART CATH AND CORONARY ANGIOGRAPHY (N/A) as a surgical intervention.  The patient's history has been reviewed, patient examined, no change in status, stable for surgery.  I have reviewed the patient's chart and labs.  Questions were answered to the patient's satisfaction.    2016 Appropriate Use Criteria for Coronary Revascularization in Patients With Acute Coronary Syndrome NSTEMI/UA Intermediate Risk (TIMI Score 3-4) NSTEMI/Unstable angina, stabilized patient at Intermediate Risk (TIMI Score 3-4) Link Here: http://dodson-rose.net/ Indication:  Revascularization by PCI or CABG of 1 or more arteries in a patient with NSTEMI or unstable angina with Stabilization after presentation Intermediate risk for clinical events  A (7) Indication: 16; Score 7     [place]

## 2019-04-20 NOTE — Progress Notes (Signed)
ANTICOAGULATION CONSULT NOTE - Follow Up Consult  Pharmacy Consult for Heparin Indication: chest pain/ACS  No Known Allergies  Patient Measurements: Height: 5\' 9"  (175.3 cm) Weight: 234 lb 9.6 oz (106.4 kg) IBW/kg (Calculated) : 66.2 Heparin Dosing Weight: 90.2 kg  Vital Signs: Temp: 98.3 F (36.8 C) (04/27 0514) Temp Source: Oral (04/27 0514) BP: 107/87 (04/27 0854) Pulse Rate: 67 (04/27 0514)  Labs: Recent Labs    04/19/19 0019 04/19/19 0320  04/19/19 1130 04/19/19 1942 04/20/19 0436  HGB 12.7  --   --   --   --  11.4*  HCT 39.5  --   --   --   --  34.6*  PLT 303  --   --   --   --  267  HEPARINUNFRC  --   --    < > 0.65 0.32 0.39  CREATININE 0.86  --   --   --   --  0.92  TROPONINI 0.11* 0.21*  --   --   --   --    < > = values in this interval not displayed.    Estimated Creatinine Clearance: 100.3 mL/min (by C-G formula based on SCr of 0.92 mg/dL).   Assessment:  Anticoag: heparin for NSTEMI. HL 0.39 in goal. Hgb 12.7>11.4. Plts 267 ok.   Goal of Therapy:  Heparin level 0.3-0.7 units/ml Monitor platelets by anticoagulation protocol: Yes   Plan:  Cath Monday Heparin 900 units/hr Daily HL and CBC  Charlesia Canaday S. Alford Highland, PharmD, BCPS Clinical Staff Pharmacist [facility]ilene Ghazi [facility] 04/20/2019,9:30 AM

## 2019-04-21 ENCOUNTER — Telehealth: Payer: Self-pay

## 2019-04-21 NOTE — Telephone Encounter (Signed)
-----   Message from Nigel Mormon, MD sent at 04/20/2019 12:58 PM EDT ----- Regarding: [facility] Discharge follow up: TOC: Needed Follow up appt: Scheduled Discharge diagnosis: NSTEMI Discharge date: 04/20/2019  Thanks MJP

## 2019-04-21 NOTE — Telephone Encounter (Signed)
Location of hospitalization: [place] Reason for hospitalization: NSTEMI Date of discharge: 04/17/19 Date of first communication with patient: today Person contacting patient: Me Current symptoms: n/a Do you understand why you were in the Hospital: Yes Questions regarding discharge instructions: None Where were you discharged to: Home Medications reviewed: Yes Allergies reviewed: Yes Dietary changes reviewed: Yes. Discussed low fat and low salt diet.  Referals reviewed: NA Activities of Daily Living: Able to with mild limitations Any transportation issues/concerns: None Any patient concerns: None Confirmed importance & date/time of Follow up appt: Yes Confirmed with patient if condition begins to worsen call. Pt was given the office number and encouraged to call back with questions or concerns: Yes

## 2019-04-22 NOTE — Progress Notes (Signed)
See if she needs Brilinta card please. Set up OV in 1 week to 10 days and do TOC tomorrow please

## 2019-04-23 ENCOUNTER — Ambulatory Visit: Payer: 59 | Admitting: Nurse Practitioner

## 2019-04-23 ENCOUNTER — Other Ambulatory Visit: Payer: Self-pay

## 2019-04-23 ENCOUNTER — Encounter: Payer: Self-pay | Admitting: Nurse Practitioner

## 2019-04-23 ENCOUNTER — Telehealth: Payer: Self-pay

## 2019-04-23 VITALS — BP 121/71 | HR 86

## 2019-04-23 DIAGNOSIS — I214 Non-ST elevation (NSTEMI) myocardial infarction: Secondary | ICD-10-CM

## 2019-04-23 DIAGNOSIS — I249 Acute ischemic heart disease, unspecified: Secondary | ICD-10-CM

## 2019-04-23 NOTE — Telephone Encounter (Signed)
Patient consented to virtual visit YRL,RMA

## 2019-04-23 NOTE — Progress Notes (Signed)
Virtual Visit via Video ([email or web address])   This visit type was conducted due to national recommendations for restrictions regarding the COVID-19 Pandemic (e.g. social distancing) in an effort to limit this patient's exposure and mitigate transmission in our community.  Patients identity confirmed using two different identifiers.  This format is felt to be most appropriate for this patient at this time.  All issues noted in this document were discussed and addressed.  No physical exam was performed (except for noted visual exam findings with Video Visits).    Date:  04/23/2019   ID:  Lauren Carr, DOB 1976/10/30, MRN [id number]  Patient Location:  Home - spoke with Lauren Carr  Provider location:   Office    Chief Complaint:  Hospital F/u   History of Present Illness:    Lauren Carr is a 43 y.o. female who presents via video conferencing for a telehealth visit today.    The patient does not have symptoms concerning for COVID-19 infection (fever, chills, cough, or new shortness of breath).   Hospital follow up admission on 4/25-4/27. She was admitted with chest pain had been occurring for almost 24 hours and she thought it might be reflux. She is home now with fatigue and intermittent chest "pain".  Prior on Wednesday and Thursday before she had abdominal pain.  .   She had an elevated troponin of 0.21 According to her records she had Coronary angiography on 04/20/2019 did not show any obstructive CAD. LVEDP was elevated at 22 mmHg.  She was diagnosed with NSTEMI - She is being discharged today on DAPT with Aspirin and Brilinta, along with high intensity statin, and low dose metoprolol.  She also had reported her mother had a history or prolonged QT  No home health nurse.  She is to follow up with Cardiology on May 6th.  She has not gotten her CPAP.     Past Medical History:  Diagnosis Date  . Hypertension   . Obstructive sleep apnea    Past Surgical History:  Procedure  Laterality Date  . CESAREAN SECTION    . CHOLECYSTECTOMY    . LEFT HEART CATH AND CORONARY ANGIOGRAPHY N/A 04/20/2019   Procedure: LEFT HEART CATH AND CORONARY ANGIOGRAPHY;  Surgeon: Nigel Mormon, MD;  Location: [facility];  Service: Cardiovascular;  Laterality: N/A;     Current Meds  Medication Sig  . aspirin EC 81 MG EC tablet Take 1 tablet (81 mg total) by mouth daily.  Marland Kitchen atorvastatin (LIPITOR) 80 MG tablet Take 1 tablet (80 mg total) by mouth daily at 6 PM.  . hydrochlorothiazide (HYDRODIURIL) 12.5 MG tablet Take 1 tablet by mouth once daily (Patient taking differently: Take 12.5 mg by mouth every morning. )  . Insulin Pen Needle (NOVOFINE PLUS) 32G X 4 MM MISC by Does not apply route. Use with saxenda once per day  . metoprolol tartrate (LOPRESSOR) 25 MG tablet Take 0.5 tablets (12.5 mg total) by mouth 2 (two) times daily.  . nitroGLYCERIN (NITROSTAT) 0.4 MG SL tablet Place 1 tablet (0.4 mg total) under the tongue every 5 (five) minutes x 3 doses as needed for chest pain.  Marland Kitchen sertraline (ZOLOFT) 25 MG tablet Take 1 tablet (25 mg total) by mouth daily. (Patient taking differently: Take 25 mg by mouth at bedtime. )  . telmisartan (MICARDIS) 20 MG tablet TAKE 1 TABLET BY MOUTH ONCE DAILY (Patient taking differently: Take 20 mg by mouth every morning. )  . ticagrelor (BRILINTA) 90  MG TABS tablet Take 1 tablet (90 mg total) by mouth 2 (two) times daily.     Allergies:   Patient has no known allergies.   Social History   Tobacco Use  . Smoking status: Never Smoker  . Smokeless tobacco: Never Used  Substance Use Topics  . Alcohol use: Yes    Alcohol/week: 2.0 standard drinks    Types: 2 Glasses of wine per week  . Drug use: No     Family Hx: The patient's family history includes Breast cancer in her mother; CAD in her maternal grandfather and paternal grandfather; Cancer - Cervical in her maternal grandmother; Cancer - Prostate in her father; Diabetes in her maternal  grandmother; Hypertension in her mother; Long QT syndrome in her mother.  ROS:   Please see the history of present illness.    Review of Systems  Constitutional: Positive for malaise/fatigue. Negative for fever.  Respiratory: Negative.   Cardiovascular: Negative.  Negative for chest pain, palpitations and orthopnea.  Neurological: Negative.   Psychiatric/Behavioral: Negative.     All other systems reviewed and are negative.   Labs/Other Tests and Data Reviewed:    Recent Labs: 07/24/2018: ALT 39 04/19/2019: B Natriuretic Peptide 34.2; TSH 1.892 04/20/2019: BUN 11; Creatinine, Ser 0.92; Hemoglobin 11.4; Platelets 267; Potassium 3.5; Sodium 137   Recent Lipid Panel Lab Results  Component Value Date/Time   CHOL 203 (H) 04/19/2019 09:19 AM   TRIG 125 04/19/2019 09:19 AM   HDL 53 04/19/2019 09:19 AM   CHOLHDL 3.8 04/19/2019 09:19 AM   LDLCALC 125 (H) 04/19/2019 09:19 AM    Wt Readings from Last 3 Encounters:  04/20/19 234 lb 9.6 oz (106.4 kg)  11/24/18 230 lb (104.3 kg)  10/09/18 228 lb (103.4 kg)     Exam:    Vital Signs:  BP 121/71   Pulse 86   LMP 04/10/2019     Physical Exam  Constitutional: She is oriented to person, place, and time and well-developed, well-nourished, and in no distress.  Pulmonary/Chest: Effort normal.  Musculoskeletal:        General: No edema.  Neurological: She is alert and oriented to person, place, and time.  Psychiatric: Mood, memory, affect and judgment normal.    ASSESSMENT & PLAN:    1. NSTEMI (non-ST elevated myocardial infarction) ([place])  Admitted for chest pain on 4/25-4/27, she had an elevated troponin.  She was started on statin, Brillinta and DVAP TCM Performed. A member of the clinical team spoke with the patient upon dischare. Discharge summary was reviewed in full detail during the visit. Meds reconciled and compared to discharge meds. Medication list is updated and reviewed with the patient.  Greater than 50% face to face time  was spent in counseling an coordination of care.  All questions were answered to the satisfaction of the patient.   She is to follow up with Dr Nadyne Coombes May 6   2. ACS (acute coronary syndrome) ([place])  See #1  She reports her mother has a history of prolonged QT interval   COVID-19 Education: The signs and symptoms of COVID-19 were discussed with the patient and how to seek care for testing (follow up with PCP or arrange E-visit).  The importance of social distancing was discussed today.  Patient Risk:   After full review of this patients clinical status, I feel that they are at least moderate risk at this time.  Time:   Today, I have spent 16 minutes/ seconds with the patient with  telehealth technology discussing above diagnoses.     Medication Adjustments/Labs and Tests Ordered: Current medicines are reviewed at length with the patient today.  Concerns regarding medicines are outlined above.   Tests Ordered: No orders of the defined types were placed in this encounter.   Medication Changes: No orders of the defined types were placed in this encounter.   Disposition:  Follow up prn  Signed, [name], FNP

## 2019-04-27 ENCOUNTER — Other Ambulatory Visit: Payer: Self-pay | Admitting: Nurse Practitioner

## 2019-04-28 ENCOUNTER — Encounter: Payer: Self-pay | Admitting: Cardiology

## 2019-04-28 NOTE — Progress Notes (Signed)
Virtual Visit via Video Note   Subjective:   Lauren Carr, female    DOB: [birth year], 43 y.o.   MRN: [id number]   I connected with the patient on 04/29/2019 by a video enabled telemedicine application and verified that I am speaking with the correct person using two identifiers.     I discussed the limitations of evaluation and management by telemedicine and the availability of in person appointments. The patient expressed understanding and agreed to proceed.   This visit type was conducted due to national recommendations for restrictions regarding the COVID-19 Pandemic (e.g. social distancing).  This format is felt to be most appropriate for this patient at this time.  All issues noted in this document were discussed and addressed.  No physical exam was performed (except for noted visual exam findings with Tele health visits).  The patient has consented to conduct a Tele health visit and understands insurance will be billed.     Chief complaint:  Chest pain   43 y/o [place] American female with hypertension, hyperlipidemia, family h/o long QT syndrome, admitted with chest pain in 03/2019. She was found to have trop elevation to 0.21 ng/mL. Coronary angiography on 04/20/2019 did not show any obstructive CAD. LVEDP was elevated at 22 mmHg. She was discharged today on DAPT with Aspirin and Brilinta, along with high intensity statin, and low dose metoprolol. I continued baseline antihypertensive therapy. I encouraged her to find out more aobut her mother's history of long QT syndrome and whether she underwent genetic testing.   Since discharge, she has been doing well. She has a few episodes of sharp chest pain, lasting only for 30-45 seconds. She denies any exertional chest pain, or pain similar to what she had leading to her hospitalization last week. She is compliant with her medical therapy. BP is well controlled.   She states that she is unaware of genetic testing performed in her  mother.    Past Medical History:  Diagnosis Date  . Hypertension   . Obstructive sleep apnea      Past Surgical History:  Procedure Laterality Date  . CESAREAN SECTION    . CHOLECYSTECTOMY    . LEFT HEART CATH AND CORONARY ANGIOGRAPHY N/A 04/20/2019   Procedure: LEFT HEART CATH AND CORONARY ANGIOGRAPHY;  Surgeon: Nigel Mormon, MD;  Location: [facility];  Service: Cardiovascular;  Laterality: N/A;     Social History   Socioeconomic History  . Marital status: Divorced    Spouse name: Not on file  . Number of children: 3  . Years of education: Not on file  . Highest education level: Not on file  Occupational History  . Not on file  Social Needs  . Financial resource strain: Not on file  . Food insecurity:    Worry: Not on file    Inability: Not on file  . Transportation needs:    Medical: Not on file    Non-medical: Not on file  Tobacco Use  . Smoking status: Never Smoker  . Smokeless tobacco: Never Used  Substance and Sexual Activity  . Alcohol use: Not Currently    Alcohol/week: 0.0 standard drinks  . Drug use: No  . Sexual activity: Not on file  Lifestyle  . Physical activity:    Days per week: Not on file    Minutes per session: Not on file  . Stress: Not on file  Relationships  . Social connections:    Talks on phone: Not on file  Gets together: Not on file    Attends religious service: Not on file    Active member of club or organization: Not on file    Attends meetings of clubs or organizations: Not on file    Relationship status: Not on file  . Intimate partner violence:    Fear of current or ex partner: Not on file    Emotionally abused: Not on file    Physically abused: Not on file    Forced sexual activity: Not on file  Other Topics Concern  . Not on file  Social History Narrative  . Not on file     Family History  Problem Relation Age of Onset  . Breast cancer Mother   . Hypertension Mother   . Long QT syndrome Mother         Diagnosed in 48s after syncope. Has ICD placed  . Cancer - Prostate Father   . Cancer - Cervical Maternal Grandmother   . Diabetes Maternal Grandmother   . CAD Maternal Grandfather   . CAD Paternal Grandfather      Current Outpatient Medications on File Prior to Visit  Medication Sig Dispense Refill  . aspirin EC 81 MG EC tablet Take 1 tablet (81 mg total) by mouth daily. 60 tablet 2  . atorvastatin (LIPITOR) 80 MG tablet Take 1 tablet (80 mg total) by mouth daily at 6 PM. 60 tablet 3  . hydrochlorothiazide (HYDRODIURIL) 12.5 MG tablet Take 1 tablet by mouth once daily 30 tablet 0  . metoprolol tartrate (LOPRESSOR) 25 MG tablet Take 0.5 tablets (12.5 mg total) by mouth 2 (two) times daily. 60 tablet 2  . nitroGLYCERIN (NITROSTAT) 0.4 MG SL tablet Place 1 tablet (0.4 mg total) under the tongue every 5 (five) minutes x 3 doses as needed for chest pain. 30 tablet 2  . sertraline (ZOLOFT) 25 MG tablet Take 1 tablet (25 mg total) by mouth daily. (Patient taking differently: Take 25 mg by mouth at bedtime. ) 90 tablet 1  . telmisartan (MICARDIS) 20 MG tablet TAKE 1 TABLET BY MOUTH ONCE DAILY (Patient taking differently: Take 20 mg by mouth every morning. ) 90 tablet 1  . ticagrelor (BRILINTA) 90 MG TABS tablet Take 1 tablet (90 mg total) by mouth 2 (two) times daily. 60 tablet 2   No current facility-administered medications on file prior to visit.     Cardiovascular studies:  EKG 04/20/2019: Sinus rhythm 72 bpm. Possible old anteroseptal infarct  Coronary angiogram 04/20/2019: LM: Normal LAD: Medium caliber. Mid LAD myocardial bridge. Small diagonal branches. No significant stenoses. LCx: Small caliber. Normal RCA: Dominant. No stenoses LVEDP 22 mmHg  Impression: No angiographically evident coronary artery disease  Recommendation: One year DAPT for medical treatment of NSTEMI  Echocardiogram 04/19/2019: 1. Mild hypokinesis of the left ventricular, basal-mid  inferolateral wall. 2. The left ventricle has normal systolic function, with an ejection fraction of 60-65%. Left ventricular diastolic function could not be evaluated. 3. The right ventricle has normal systolc function. The cavity was normal. 4. The aortic valve is tricuspid Aortic valve regurgitation is mild by color flow Doppler. 5. The aortic root and ascending aorta are normal in size and structure.   Recent labs: Results for Lauren Carr, Lauren Carr (MRN [id number]) as of 04/28/2019 22:13  Ref. Range 04/19/2019 03:20 04/19/2019 06:12 04/19/2019 09:19 04/20/2019 64:40  BASIC METABOLIC PANEL Unknown    Rpt (A)  Sodium Latest Ref Range: 135 - 145 mmol/L    137  Potassium Latest  Ref Range: 3.5 - 5.1 mmol/L    3.5  Chloride Latest Ref Range: 98 - 111 mmol/L    101  CO2 Latest Ref Range: 22 - 32 mmol/L    25  Glucose Latest Ref Range: 70 - 99 mg/dL    111 (H)  BUN Latest Ref Range: 6 - 20 mg/dL    11  Creatinine Latest Ref Range: 0.44 - 1.00 mg/dL    0.92  Calcium Latest Ref Range: 8.9 - 10.3 mg/dL    9.3  Anion gap Latest Ref Range: 5 - 15     11  GFR, Est Non African American Latest Ref Range: >60 mL/min    >60  GFR, Est African American Latest Ref Range: >60 mL/min    >60  B Natriuretic Peptide Latest Ref Range: 0.0 - 100.0 pg/mL  34.2    Troponin I Latest Ref Range: <0.03 ng/mL 0.21 (HH)     Total CHOL/HDL Ratio Latest Units: RATIO   3.8   Cholesterol Latest Ref Range: 0 - 200 mg/dL   203 (H)   HDL Cholesterol Latest Ref Range: >40 mg/dL   53   LDL (calc) Latest Ref Range: 0 - 99 mg/dL   125 (H)   Triglycerides Latest Ref Range: <150 mg/dL   125   VLDL Latest Ref Range: 0 - 40 mg/dL   25    Results for Lauren Carr, Lauren Carr (MRN [id number]) as of 04/28/2019 22:13  Ref. Range 04/20/2019 04:36  WBC Latest Ref Range: 4.0 - 10.5 K/uL 5.9  RBC Latest Ref Range: 3.87 - 5.11 MIL/uL 4.20  Hemoglobin Latest Ref Range: 12.0 - 15.0 g/dL 11.4 (L)  HCT Latest Ref Range: 36.0 - 46.0 % 34.6 (L)  MCV  Latest Ref Range: 80.0 - 100.0 fL 82.4  MCH Latest Ref Range: 26.0 - 34.0 pg 27.1  MCHC Latest Ref Range: 30.0 - 36.0 g/dL 32.9  RDW Latest Ref Range: 11.5 - 15.5 % 14.3  Platelets Latest Ref Range: 150 - 400 K/uL 267  nRBC Latest Ref Range: 0.0 - 0.2 % 0.0    Review of Systems  Constitution: Negative for decreased appetite, malaise/fatigue, weight gain and weight loss.  HENT: Negative for congestion.   Eyes: Negative for visual disturbance.  Cardiovascular: Positive for chest pain (Sharp, short lasting, non exertional). Negative for dyspnea on exertion, leg swelling, palpitations and syncope.  Respiratory: Negative for shortness of breath.   Endocrine: Negative for cold intolerance.  Hematologic/Lymphatic: Does not bruise/bleed easily.  Skin: Negative for itching and rash.  Musculoskeletal: Negative for myalgias.  Gastrointestinal: Negative for abdominal pain, nausea and vomiting.  Genitourinary: Negative for dysuria.  Neurological: Negative for dizziness and weakness.  Psychiatric/Behavioral: The patient is not nervous/anxious.   All other systems reviewed and are negative.   Physical Exam  Constitutional: She is oriented to person, place, and time. She appears well-developed and well-nourished. No distress.  Cardiovascular:  Rt wrist with no hematoma/ecchymosis  Pulmonary/Chest: Effort normal.  Neurological: She is alert and oriented to person, place, and time.  Psychiatric: She has a normal mood and affect.  Nursing note and vitals reviewed.         Vitals:   04/29/19 1209  BP: 115/69  Pulse: 81   (Measured by the patient using a home BP monitor)   Observation/findings during video visit   Objective:           Assessment & Recommendations:   43 y/o [place] American female with hypertension, hyperlipidemia, family  h/o long QT syndrome, NSTEMI with no obstructive CAD on cath (03/2019)  NSTEMI: Continue DAPT for a 1 year (Aspirin/Brilinta) till 03/2020  Continue lipitor 80 mg daily.  Continue metoprolol 12. Mg bid.  Hypertension: Controlled. Also on telmisartan  Follow up in 3 months   [place], MD [facility] Pager: [phone] Office: [phone] If no answer Cell [phone]

## 2019-04-29 ENCOUNTER — Encounter: Payer: Self-pay | Admitting: Cardiology

## 2019-04-29 ENCOUNTER — Telehealth (HOSPITAL_COMMUNITY): Payer: Self-pay | Admitting: *Deleted

## 2019-04-29 ENCOUNTER — Other Ambulatory Visit: Payer: Self-pay

## 2019-04-29 ENCOUNTER — Ambulatory Visit (INDEPENDENT_AMBULATORY_CARE_PROVIDER_SITE_OTHER): Payer: 59 | Admitting: Cardiology

## 2019-04-29 VITALS — BP 115/69 | HR 81 | Ht 69.0 in | Wt 233.0 lb

## 2019-04-29 DIAGNOSIS — I252 Old myocardial infarction: Secondary | ICD-10-CM | POA: Diagnosis not present

## 2019-04-29 DIAGNOSIS — I10 Essential (primary) hypertension: Secondary | ICD-10-CM

## 2019-04-29 NOTE — Telephone Encounter (Signed)
Called and spoke to pt regarding referral for cardiac rehab and continued closure due to adherence of national recommendation for Covid-19  in group setting. Pt verbalized understanding and is walking 20 minutes twice a day.  Pt has noted some mild shortness of breath particular on inclines.  Pt did not exercise prior to her cardiac event. Pt admits that she has made changes in her diet but does have cravings at times for poor food choices. Will send pt education information on exercise and heart healthy eating tips, snacks.  Will also include information on mindful eating.  Pt appreciative and remains interested in participating in CR.  Pt has access to smartphone and Internet for possible home based cardiac rehab. Cherre Huger, BSN Cardiac and Training and development officer

## 2019-04-30 ENCOUNTER — Encounter: Payer: Self-pay | Admitting: Cardiology

## 2019-04-30 DIAGNOSIS — I252 Old myocardial infarction: Secondary | ICD-10-CM | POA: Insufficient documentation

## 2019-05-14 ENCOUNTER — Telehealth: Payer: Self-pay

## 2019-05-14 NOTE — Telephone Encounter (Signed)
Patient called stating she needs a letter for work stating that she needs to work from home and she also has some FMLA forms that need to be filled out from when she was in the hospital.  RETURNED PT CALL AND NOTIFIED [place] OFF SO Lauren MOORE FNP-BC CAN FILL THEM OUT. Lonia Mad

## 2019-05-18 ENCOUNTER — Encounter: Payer: Self-pay | Admitting: Nurse Practitioner

## 2019-06-01 ENCOUNTER — Other Ambulatory Visit: Payer: Self-pay | Admitting: Nurse Practitioner

## 2019-06-01 MED ORDER — HYDROCHLOROTHIAZIDE 12.5 MG PO TABS: 12.5000 mg | ORAL_TABLET | Freq: Every day | ORAL | 1 refills | Status: DC

## 2019-06-10 ENCOUNTER — Other Ambulatory Visit: Payer: Self-pay | Admitting: Nurse Practitioner

## 2019-06-12 ENCOUNTER — Telehealth: Payer: Self-pay

## 2019-06-12 ENCOUNTER — Telehealth (HOSPITAL_COMMUNITY): Payer: Self-pay

## 2019-06-12 NOTE — Telephone Encounter (Signed)
Phone call made to Pt to provide information about virtual CR. Pt did not answer. Message was left for Pt to return call.

## 2019-06-12 NOTE — Telephone Encounter (Signed)
I called pt to notify her the forms she dropped off have been completed and faxed on 06/19. Pt has requested that I mail her a copy. YRL,RMA

## 2019-06-18 ENCOUNTER — Telehealth (HOSPITAL_COMMUNITY): Payer: Self-pay | Admitting: *Deleted

## 2019-06-18 NOTE — Telephone Encounter (Signed)
Pt is interested in participating in Virtual Cardiac Rehab. Pt advised that Virtual Cardiac Rehab is provided at no cost to the patient.  Checklist:  1. Pt has smart device  ie smartphone and/or ipad for downloading an app  Yes 2. Reliable internet/wifi service    Yes 3. Understands how to use their smartphone and navigate within an app.  Yes   Reviewed with pt the scheduling process for virtual cardiac rehab.  Pt verbalized understanding.            Confirm Consent - In the setting of the current Covid19 crisis, you are scheduled for a phone visit with your Cardiac or Pulmonary team member.  Just as we do with many in-gym visits, in order for you to participate in this visit, we must obtain consent.  If you'd like, I can send this to your mychart (if signed up) or email for you to review.  Otherwise, I can obtain your verbal consent now.  By agreeing to a telephone visit, we'd like you to understand that the technology does not allow for your Cardiac or Pulmonary Rehab team member to perform a physical assessment, and thus may limit their ability to fully assess your ability to perform exercise programs. If your provider identifies any concerns that need to be evaluated in person, we will make arrangements to do so.  Finally, though the technology is pretty good, we cannot assure that it will always work on either your or our end and we cannot ensure that we have a secure connection.  Cardiac and Pulmonary Rehab Telehealth visits and "At Home" cardiac and pulmonary rehab are provided at no cost to you.        Are you willing to proceed?"        STAFF: Did the patient verbally acknowledge consent to telehealth visit? Document YES/NO here: Yes     Barnet Pall RN  Cardiac and Pulmonary Rehab Staff        Date June 25    @ Time 11:52

## 2019-06-19 ENCOUNTER — Encounter (HOSPITAL_COMMUNITY): Payer: 59

## 2019-06-19 ENCOUNTER — Telehealth (HOSPITAL_COMMUNITY): Payer: Self-pay

## 2019-06-19 NOTE — Telephone Encounter (Signed)
Attempted to call pt to set up virtual cardiac rehab app. Pt did not answer, left VM to call back.   Carma Lair MS, ACSM CEP 2:08 PM 06/19/2019

## 2019-07-10 ENCOUNTER — Other Ambulatory Visit: Payer: Self-pay

## 2019-07-10 MED ORDER — TICAGRELOR 90 MG PO TABS: 90.0000 mg | ORAL_TABLET | Freq: Two times a day (BID) | ORAL | 1 refills | Status: DC

## 2019-07-14 ENCOUNTER — Telehealth (HOSPITAL_COMMUNITY): Payer: Self-pay

## 2019-07-14 NOTE — Telephone Encounter (Signed)
Pt inactive in [facility] App.  Letter mailed requesting patient to return call regarding this by 07/21/2019. If no response, will discharge from cardiac rehab program. 

## 2019-07-29 ENCOUNTER — Other Ambulatory Visit: Payer: Self-pay

## 2019-07-29 ENCOUNTER — Encounter: Payer: Self-pay | Admitting: Nurse Practitioner

## 2019-07-29 ENCOUNTER — Ambulatory Visit (INDEPENDENT_AMBULATORY_CARE_PROVIDER_SITE_OTHER): Payer: 59 | Admitting: Nurse Practitioner

## 2019-07-29 VITALS — BP 110/62 | HR 88 | Temp 98.2°F | Ht 69.0 in | Wt 234.8 lb

## 2019-07-29 DIAGNOSIS — Z23 Encounter for immunization: Secondary | ICD-10-CM | POA: Diagnosis not present

## 2019-07-29 DIAGNOSIS — Z1239 Encounter for other screening for malignant neoplasm of breast: Secondary | ICD-10-CM

## 2019-07-29 DIAGNOSIS — E559 Vitamin D deficiency, unspecified: Secondary | ICD-10-CM

## 2019-07-29 DIAGNOSIS — R7309 Other abnormal glucose: Secondary | ICD-10-CM

## 2019-07-29 DIAGNOSIS — R3121 Asymptomatic microscopic hematuria: Secondary | ICD-10-CM

## 2019-07-29 DIAGNOSIS — I10 Essential (primary) hypertension: Secondary | ICD-10-CM | POA: Diagnosis not present

## 2019-07-29 DIAGNOSIS — Z0000 Encounter for general adult medical examination without abnormal findings: Secondary | ICD-10-CM | POA: Diagnosis not present

## 2019-07-29 LAB — POCT URINALYSIS DIPSTICK
Glucose, UA: NEGATIVE
Ketones, UA: NEGATIVE
Leukocytes, UA: NEGATIVE
Nitrite, UA: NEGATIVE
Protein, UA: POSITIVE — AB
Spec Grav, UA: 1.025 (ref 1.010–1.025)
Urobilinogen, UA: 0.2 E.U./dL
pH, UA: 5.5 (ref 5.0–8.0)

## 2019-07-29 LAB — POCT UA - MICROALBUMIN
Albumin/Creatinine Ratio, Urine, POC: 300
Creatinine, POC: 300 mg/dL
Microalbumin Ur, POC: 80 mg/L

## 2019-07-29 NOTE — Progress Notes (Signed)
Subjective:     Patient ID: Lauren Carr , female    DOB: [birth year] , 43 y.o.   MRN: [id number]   Chief Complaint  Patient presents with  . Annual Exam   The patient states she uses none for birth control. Last LMP was Patient's last menstrual period was 06/08/2019. Negative for Dysmenorrhea and Negative for Menorrhagia Mammogram last 2019 Negative for: breast discharge, breast lump(s), breast pain and breast self exam.  Pertinent negatives include abnormal bleeding (hematology), anxiety, decreased libido, depression, difficulty falling sleep, dyspareunia, history of infertility, nocturia, sexual dysfunction, sleep disturbances, urinary incontinence, urinary urgency, vaginal discharge and vaginal itching. Diet heart healthy.  She has been seeing a nutritionist. The patient states her exercise level is  minimal - she has been doing cardiac rehab.    The patient's tobacco use is:  Social History   Tobacco Use  Smoking Status Never Smoker  Smokeless Tobacco Never Used   She has been exposed to passive smoke. The patient's alcohol use is:  Social History   Substance and Sexual Activity  Alcohol Use Not Currently  . Alcohol/week: 0.0 standard drinks   Additional information: Last pap 2019, next one scheduled for this year with Dr. Garwin Brothers.   HPI  Here for [facility]  Cardiologist appointment next week.  She is currently working for the city with employees and retiree.    Wt Readings from Last 3 Encounters: 07/29/19 : 234 lb 12.8 oz (106.5 kg) 04/29/19 : 233 lb (105.7 kg) 04/20/19 : 234 lb 9.6 oz (106.4 kg)     Past Medical History:  Diagnosis Date  . Hypertension   . NSTEMI (non-ST elevated myocardial infarction) ([place])   . Obstructive sleep apnea      Family History  Problem Relation Age of Onset  . Breast cancer Mother   . Hypertension Mother   . Long QT syndrome Mother        Diagnosed in 24s after syncope. Has ICD placed  . Cancer - Prostate Father   . Cancer -  Cervical Maternal Grandmother   . Diabetes Maternal Grandmother   . CAD Maternal Grandfather   . CAD Paternal Grandfather      Current Outpatient Medications:  .  aspirin EC 81 MG EC tablet, Take 1 tablet (81 mg total) by mouth daily., Disp: 60 tablet, Rfl: 2 .  atorvastatin (LIPITOR) 80 MG tablet, Take 1 tablet (80 mg total) by mouth daily at 6 PM., Disp: 60 tablet, Rfl: 3 .  hydrochlorothiazide (HYDRODIURIL) 12.5 MG tablet, Take 1 tablet (12.5 mg total) by mouth daily., Disp: 90 tablet, Rfl: 1 .  metoprolol tartrate (LOPRESSOR) 25 MG tablet, Take 0.5 tablets (12.5 mg total) by mouth 2 (two) times daily., Disp: 60 tablet, Rfl: 2 .  nitroGLYCERIN (NITROSTAT) 0.4 MG SL tablet, Place 1 tablet (0.4 mg total) under the tongue every 5 (five) minutes x 3 doses as needed for chest pain., Disp: 30 tablet, Rfl: 2 .  telmisartan (MICARDIS) 20 MG tablet, TAKE 1 TAB BY MOUTH DAILY, Disp: 30 tablet, Rfl: 2 .  ticagrelor (BRILINTA) 90 MG TABS tablet, Take 1 tablet (90 mg total) by mouth 2 (two) times daily., Disp: 60 tablet, Rfl: 1 .  sertraline (ZOLOFT) 25 MG tablet, Take 1 tablet (25 mg total) by mouth daily. (Patient not taking: Reported on 07/29/2019), Disp: 90 tablet, Rfl: 1   No Known Allergies   Review of Systems  Constitutional: Negative.   HENT: Negative.   Eyes: Negative.  Respiratory: Negative.   Cardiovascular: Negative.   Gastrointestinal: Negative.   Endocrine: Negative.  Negative for polydipsia, polyphagia and polyuria.  Genitourinary: Negative.   Musculoskeletal: Negative.   Skin: Negative.   Allergic/Immunologic: Negative.   Neurological: Negative.   Hematological: Negative.   Psychiatric/Behavioral: Negative.      Today's Vitals   07/29/19 0918  BP: 110/62  Pulse: 88  Temp: 98.2 F (36.8 C)  TempSrc: Oral  Weight: 234 lb 12.8 oz (106.5 kg)  Height: 5\' 9"  (1.753 m)  PainSc: 0-No pain   Body mass index is 34.67 kg/m.   Objective:  Physical Exam Vitals signs  reviewed.  Constitutional:      Appearance: Normal appearance. She is well-developed. She is obese.  HENT:     Head: Normocephalic and atraumatic.     Right Ear: Hearing, tympanic membrane, ear canal and external ear normal.     Left Ear: Hearing, tympanic membrane, ear canal and external ear normal.  Eyes:     General: Lids are normal.     Extraocular Movements: Extraocular movements intact.     Conjunctiva/sclera: Conjunctivae normal.     Pupils: Pupils are equal, round, and reactive to light.     Funduscopic exam:    Right eye: No papilledema.        Left eye: No papilledema.  Neck:     Musculoskeletal: Full passive range of motion without pain, normal range of motion and neck supple.     Thyroid: No thyroid mass.     Vascular: No carotid bruit.  Cardiovascular:     Rate and Rhythm: Normal rate and regular rhythm.     Pulses: Normal pulses.     Heart sounds: Normal heart sounds. No murmur.  Pulmonary:     Effort: Pulmonary effort is normal.     Breath sounds: Normal breath sounds.  Chest:     Breasts:        Right: Normal. No mass, nipple discharge or tenderness.        Left: Normal. No mass, nipple discharge or tenderness.  Abdominal:     General: Abdomen is flat. Bowel sounds are normal.     Palpations: Abdomen is soft.  Musculoskeletal: Normal range of motion.        General: No swelling.     Right lower leg: No edema.     Left lower leg: No edema.  Lymphadenopathy:     Upper Body:     Right upper body: No supraclavicular, axillary or pectoral adenopathy.     Left upper body: No supraclavicular, axillary or pectoral adenopathy.  Skin:    General: Skin is warm and dry.     Capillary Refill: Capillary refill takes less than 2 seconds.  Neurological:     General: No focal deficit present.     Mental Status: She is alert and oriented to person, place, and time.     Cranial Nerves: No cranial nerve deficit.     Sensory: No sensory deficit.  Psychiatric:        Mood  and Affect: Mood normal.        Behavior: Behavior normal.        Thought Content: Thought content normal.        Judgment: Judgment normal.         Assessment And Plan:     1. Health maintenance examination . Behavior modifications discussed and diet history reviewed.   . Pt will continue to exercise regularly and modify diet  with low GI, plant based foods and decrease intake of processed foods.  . Recommend intake of daily multivitamin, Vitamin D, and calcium.  . Recommend mammogram and colonoscopy for preventive screenings, as well as recommend immunizations that include influenza, TDAP - CBC no Diff  2. Encounter for screening for malignant neoplasm of breast  Pt instructed on Self Breast Exam.According to ACOG guidelines Women aged 42 and older are recommended to get an annual mammogram. Form completed and given to patient contact the [facility] for appointment scheduing.   Pt encouraged to get annual mammogram - MM Digital Screening; Future  3. Encounter for immunization  - Pneumococcal polysaccharide vaccine 23-valent greater than or equal to 2yo subcutaneous/IM  4. Essential hypertension . B/P is well controlled.  . She is now seeing a cardiologist after her NSTEMI . CMP ordered to check renal function.  . The importance of regular exercise and dietary modification was stressed to the patient.  . Stressed importance of losing ten percent of her body weight to help with B/P control.  . The weight loss would help with decreasing cardiac and cancer risk as well.  - POCT Urinalysis Dipstick (81002) - POCT UA - Microalbumin - BMP8+Anion Gap - Lipid Profile  5. Vitamin D deficiency  Will check vitamin D level and supplement as needed.     Also encouraged to spend 15 minutes in the sun daily.  - Vitamin D (25 hydroxy)  6. Abnormal glucose  Chronic,stable  no current medications  Encouraged to limit intake of sugary foods and drinks  Encouraged to  increase physical activity to 150 minutes per week when you are cleared - Hemoglobin A1c  7. Asymptomatic microscopic hematuria  Will send urine for culture due to moderate blood in urine - Culture, Urine   [name], FNP    THE PATIENT IS ENCOURAGED TO PRACTICE SOCIAL DISTANCING DUE TO THE COVID-19 PANDEMIC.

## 2019-07-30 LAB — BMP8+ANION GAP
Anion Gap: 14 mmol/L (ref 10.0–18.0)
BUN/Creatinine Ratio: 12 (ref 9–23)
BUN: 12 mg/dL (ref 6–24)
CO2: 24 mmol/L (ref 20–29)
Calcium: 9.9 mg/dL (ref 8.7–10.2)
Chloride: 98 mmol/L (ref 96–106)
Creatinine, Ser: 0.98 mg/dL (ref 0.57–1.00)
GFR calc Af Amer: 81 mL/min/{1.73_m2} (ref >59)
GFR calc non Af Amer: 70 mL/min/{1.73_m2} (ref >59)
Glucose: 95 mg/dL (ref 65–99)
Potassium: 4 mmol/L (ref 3.5–5.2)
Sodium: 136 mmol/L (ref 134–144)

## 2019-07-30 LAB — HEMOGLOBIN A1C
Est. average glucose Bld gHb Est-mCnc: 117 mg/dL
Hgb A1c MFr Bld: 5.7 % — ABNORMAL HIGH (ref 4.8–5.6)

## 2019-07-30 LAB — LIPID PANEL
Chol/HDL Ratio: 2.4 ratio (ref 0.0–4.4)
Cholesterol, Total: 128 mg/dL (ref 100–199)
HDL: 54 mg/dL (ref >39)
LDL Calculated: 54 mg/dL (ref 0–99)
Triglycerides: 98 mg/dL (ref 0–149)
VLDL Cholesterol Cal: 20 mg/dL (ref 5–40)

## 2019-07-30 LAB — CBC
Hematocrit: 35.1 % (ref 34.0–46.6)
Hemoglobin: 11.3 g/dL (ref 11.1–15.9)
MCH: 26.6 pg (ref 26.6–33.0)
MCHC: 32.2 g/dL (ref 31.5–35.7)
MCV: 83 fL (ref 79–97)
Platelets: 256 10*3/uL (ref 150–450)
RBC: 4.25 x10E6/uL (ref 3.77–5.28)
RDW: 15 % (ref 11.7–15.4)
WBC: 5.4 10*3/uL (ref 3.4–10.8)

## 2019-07-30 LAB — URINE CULTURE: Organism ID, Bacteria: NO GROWTH

## 2019-07-30 LAB — VITAMIN D 25 HYDROXY (VIT D DEFICIENCY, FRACTURES): Vit D, 25-Hydroxy: 28 ng/mL — ABNORMAL LOW (ref 30.0–100.0)

## 2019-08-03 ENCOUNTER — Ambulatory Visit (INDEPENDENT_AMBULATORY_CARE_PROVIDER_SITE_OTHER): Payer: 59 | Admitting: Cardiology

## 2019-08-03 ENCOUNTER — Other Ambulatory Visit: Payer: Self-pay

## 2019-08-03 ENCOUNTER — Encounter: Payer: Self-pay | Admitting: Cardiology

## 2019-08-03 VITALS — BP 107/67 | HR 81 | Ht 69.0 in | Wt 236.0 lb

## 2019-08-03 DIAGNOSIS — I10 Essential (primary) hypertension: Secondary | ICD-10-CM | POA: Diagnosis not present

## 2019-08-03 DIAGNOSIS — I252 Old myocardial infarction: Secondary | ICD-10-CM | POA: Diagnosis not present

## 2019-08-03 DIAGNOSIS — R0789 Other chest pain: Secondary | ICD-10-CM | POA: Diagnosis not present

## 2019-08-03 DIAGNOSIS — R079 Chest pain, unspecified: Secondary | ICD-10-CM | POA: Insufficient documentation

## 2019-08-03 MED ORDER — AMLODIPINE BESYLATE 5 MG PO TABS: 5.0000 mg | ORAL_TABLET | Freq: Every day | ORAL | 3 refills | Status: DC

## 2019-08-03 NOTE — Progress Notes (Signed)
Virtual Visit via Video Note   Subjective:   Lauren Carr, female    DOB: 12-Feb-1976, 43 y.o.   MRN: [id number]    Chief complaint:  Chest pain  43 y/o [place] American female with hypertension, hyperlipidemia, family h/o long QT syndrome, NSTEMI with no obstructive CAD on cath (03/2019)  Patient was admitted with chest pain in 03/2019. She was found to have trop elevation to 0.21 ng/mL. Coronary angiography on 04/20/2019 did not show any obstructive CAD. LVEDP was elevated at 22 mmHg. She was discharged on DAPT with Aspirin and Brilinta, along with high intensity statin, and low dose metoprolol. I continued baseline antihypertensive therapy.  Patient has had significant improvement in her shortness of breath over last few months.  She has had to use nitroglycerin 4 times in close to 3 months due to chest pain.  Nitroglycerin does improve her chest pain.  She is improving her visit with activity, and generally does not have pain with exertion.  She denies any episodes of presyncope, syncope, palpitations.   Past Medical History:  Diagnosis Date  . Hypertension   . NSTEMI (non-ST elevated myocardial infarction) ([place])   . Obstructive sleep apnea      Past Surgical History:  Procedure Laterality Date  . CESAREAN SECTION    . CHOLECYSTECTOMY    . LEFT HEART CATH AND CORONARY ANGIOGRAPHY N/A 04/20/2019   Procedure: LEFT HEART CATH AND CORONARY ANGIOGRAPHY;  Surgeon: Nigel Mormon, MD;  Location: [place] [facility];  Service: Cardiovascular;  Laterality: N/A;     Social History   Socioeconomic History  . Marital status: Divorced    Spouse name: Not on file  . Number of children: 3  . Years of education: Not on file  . Highest education level: Not on file  Occupational History  . Not on file  Social Needs  . Financial resource strain: Not on file  . Food insecurity    Worry: Not on file    Inability: Not on file  . Transportation needs    Medical: Not on file     Non-medical: Not on file  Tobacco Use  . Smoking status: Never Smoker  . Smokeless tobacco: Never Used  Substance and Sexual Activity  . Alcohol use: Not Currently    Alcohol/week: 0.0 standard drinks  . Drug use: No  . Sexual activity: Not on file  Lifestyle  . Physical activity    Days per week: Not on file    Minutes per session: Not on file  . Stress: Not on file  Relationships  . Social Herbalist on phone: Not on file    Gets together: Not on file    Attends religious service: Not on file    Active member of club or organization: Not on file    Attends meetings of clubs or organizations: Not on file    Relationship status: Not on file  . Intimate partner violence    Fear of current or ex partner: Not on file    Emotionally abused: Not on file    Physically abused: Not on file    Forced sexual activity: Not on file  Other Topics Concern  . Not on file  Social History Narrative  . Not on file     Family History  Problem Relation Age of Onset  . Breast cancer Mother   . Hypertension Mother   . Long QT syndrome Mother  Diagnosed in 1990s after syncope. Has ICD placed  . Cancer - Prostate Father   . Cancer - Cervical Maternal Grandmother   . Diabetes Maternal Grandmother   . CAD Maternal Grandfather   . CAD Paternal Grandfather      Current Outpatient Medications on File Prior to Visit  Medication Sig Dispense Refill  . aspirin EC 81 MG EC tablet Take 1 tablet (81 mg total) by mouth daily. 60 tablet 2  . atorvastatin (LIPITOR) 80 MG tablet Take 1 tablet (80 mg total) by mouth daily at 6 PM. 60 tablet 3  . hydrochlorothiazide (HYDRODIURIL) 12.5 MG tablet Take 1 tablet (12.5 mg total) by mouth daily. 90 tablet 1  . metoprolol tartrate (LOPRESSOR) 25 MG tablet Take 0.5 tablets (12.5 mg total) by mouth 2 (two) times daily. 60 tablet 2  . nitroGLYCERIN (NITROSTAT) 0.4 MG SL tablet Place 1 tablet (0.4 mg total) under the tongue every 5 (five)  minutes x 3 doses as needed for chest pain. 30 tablet 2  . sertraline (ZOLOFT) 25 MG tablet Take 1 tablet (25 mg total) by mouth daily. (Patient not taking: Reported on 07/29/2019) 90 tablet 1  . telmisartan (MICARDIS) 20 MG tablet TAKE 1 TAB BY MOUTH DAILY 30 tablet 2  . ticagrelor (BRILINTA) 90 MG TABS tablet Take 1 tablet (90 mg total) by mouth 2 (two) times daily. 60 tablet 1   No current facility-administered medications on file prior to visit.     Cardiovascular studies:  EKG 08/03/2019: Sinus rhythm 78 bpm. 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  Nonspecific T-abnormality.   Normal QTc interval.  EKG 04/20/2019: Sinus rhythm 72 bpm. Possible old anteroseptal infarct  Coronary angiogram 04/20/2019: LM: Normal LAD: Medium caliber. Mid LAD myocardial bridge. Small diagonal branches. No significant stenoses. LCx: Small caliber. Normal RCA: Dominant. No stenoses LVEDP 22 mmHg  Impression: No angiographically evident coronary artery disease  Recommendation: One year DAPT for medical treatment of NSTEMI  Echocardiogram 04/19/2019: 1. Mild hypokinesis of the left ventricular, basal-mid inferolateral wall. 2. The left ventricle has normal systolic function, with an ejection fraction of 60-65%. Left ventricular diastolic function could not be evaluated. 3. The right ventricle has normal systolc function. The cavity was normal. 4. The aortic valve is tricuspid Aortic valve regurgitation is mild by color flow Doppler. 5. The aortic root and ascending aorta are normal in size and structure.   Recent labs: Results for Lauren Carr, Lauren (MRN [id number]) as of 04/28/2019 22:13  Ref. Range 04/19/2019 03:20 04/19/2019 06:12 04/19/2019 09:19 04/20/2019 40:98  BASIC METABOLIC PANEL Unknown    Rpt (A)  Sodium Latest Ref Range: 135 - 145 mmol/L    137  Potassium Latest Ref Range: 3.5 - 5.1 mmol/L    3.5  Chloride Latest Ref Range: 98 - 111 mmol/L    101  CO2 Latest Ref Range: 22 - 32 mmol/L     25  Glucose Latest Ref Range: 70 - 99 mg/dL    111 (H)  BUN Latest Ref Range: 6 - 20 mg/dL    11  Creatinine Latest Ref Range: 0.44 - 1.00 mg/dL    0.92  Calcium Latest Ref Range: 8.9 - 10.3 mg/dL    9.3  Anion gap Latest Ref Range: 5 - 15     11  GFR, Est Non African American Latest Ref Range: >60 mL/min    >60  GFR, Est African American Latest Ref Range: >60 mL/min    >60  B Natriuretic Peptide Latest Ref Range:  0.0 - 100.0 pg/mL  34.2    Troponin I Latest Ref Range: <0.03 ng/mL 0.21 (HH)     Total CHOL/HDL Ratio Latest Units: RATIO   3.8   Cholesterol Latest Ref Range: 0 - 200 mg/dL   203 (H)   HDL Cholesterol Latest Ref Range: >40 mg/dL   53   LDL (calc) Latest Ref Range: 0 - 99 mg/dL   125 (H)   Triglycerides Latest Ref Range: <150 mg/dL   125   VLDL Latest Ref Range: 0 - 40 mg/dL   25    Results for Lauren Carr, Lauren Carr (MRN [id number]) as of 04/28/2019 22:13  Ref. Range 04/20/2019 04:36  WBC Latest Ref Range: 4.0 - 10.5 K/uL 5.9  RBC Latest Ref Range: 3.87 - 5.11 MIL/uL 4.20  Hemoglobin Latest Ref Range: 12.0 - 15.0 g/dL 11.4 (L)  HCT Latest Ref Range: 36.0 - 46.0 % 34.6 (L)  MCV Latest Ref Range: 80.0 - 100.0 fL 82.4  MCH Latest Ref Range: 26.0 - 34.0 pg 27.1  MCHC Latest Ref Range: 30.0 - 36.0 g/dL 32.9  RDW Latest Ref Range: 11.5 - 15.5 % 14.3  Platelets Latest Ref Range: 150 - 400 K/uL 267  nRBC Latest Ref Range: 0.0 - 0.2 % 0.0    Review of Systems  Constitution: Negative for decreased appetite, malaise/fatigue, weight gain and weight loss.  HENT: Negative for congestion.   Eyes: Negative for visual disturbance.  Cardiovascular: Positive for chest pain (Sharp, short lasting, non exertional). Negative for dyspnea on exertion, leg swelling, palpitations and syncope.  Respiratory: Negative for shortness of breath.   Endocrine: Negative for cold intolerance.  Hematologic/Lymphatic: Does not bruise/bleed easily.  Skin: Negative for itching and rash.  Musculoskeletal: Negative  for myalgias.  Gastrointestinal: Negative for abdominal pain, nausea and vomiting.  Genitourinary: Negative for dysuria.  Neurological: Negative for dizziness and weakness.  Psychiatric/Behavioral: The patient is not nervous/anxious.   All other systems reviewed and are negative.   Vitals:   08/03/19 1016  BP: 107/67  Pulse: 81  SpO2: 98%      Physical Exam  Constitutional: She is oriented to person, place, and time. She appears well-developed and well-nourished. No distress.  HENT:  Head: Normocephalic and atraumatic.  Eyes: Pupils are equal, round, and reactive to light. Conjunctivae are normal.  Neck: No JVD present.  Cardiovascular: Normal rate, regular rhythm and intact distal pulses.  Rt wrist with no hematoma/ecchymosis  Pulmonary/Chest: Effort normal and breath sounds normal. She has no wheezes. She has no rales.  Abdominal: Soft. Bowel sounds are normal. There is no rebound.  Musculoskeletal:        General: No edema.  Lymphadenopathy:    She has no cervical adenopathy.  Neurological: She is alert and oriented to person, place, and time. No cranial nerve deficit.  Skin: Skin is warm and dry.  Psychiatric: She has a normal mood and affect.  Nursing note and vitals reviewed.            Assessment & Recommendations:   43 y/o [place] American female with hypertension, hyperlipidemia, family h/o long QT syndrome, NSTEMI with no obstructive CAD on cath (03/2019)  NSTEMI: 03/2020. No obstructive CAD on coronary angiogram. Continue DAPT for a 1 year (Aspirin/Brilinta) till 03/2020 Continue lipitor 80 mg daily.  Continue metoprolol 12.5 mg bid. Start amlodipine 5 mg daily, in view of her nitrate responsive chest pain-which could be from microvascular disease.  Hypertension: Controlled.Stopped telmisartan in view of starting amlodipine  Follow up in 3 months   Aleeha Boline Esther Hardy, MD [facility] Pager: [phone] Office: [phone] If  no answer Cell [phone]

## 2019-08-04 ENCOUNTER — Telehealth (HOSPITAL_COMMUNITY): Payer: Self-pay

## 2019-08-04 NOTE — Telephone Encounter (Signed)
Phone call to Pt to check in regarding the virtual CR app. Pt did not answer and VM was left for her to return call. Encouraged Pt to increase her exercising 1-2 more days per week.

## 2019-08-11 ENCOUNTER — Ambulatory Visit (INDEPENDENT_AMBULATORY_CARE_PROVIDER_SITE_OTHER): Payer: 59

## 2019-08-11 ENCOUNTER — Other Ambulatory Visit: Payer: Self-pay

## 2019-08-11 VITALS — BP 110/62 | HR 82 | Temp 98.5°F | Ht 69.0 in | Wt 240.0 lb

## 2019-08-11 DIAGNOSIS — Z23 Encounter for immunization: Secondary | ICD-10-CM | POA: Diagnosis not present

## 2019-08-11 DIAGNOSIS — I252 Old myocardial infarction: Secondary | ICD-10-CM

## 2019-09-07 ENCOUNTER — Other Ambulatory Visit: Payer: Self-pay

## 2019-09-07 DIAGNOSIS — I214 Non-ST elevation (NSTEMI) myocardial infarction: Secondary | ICD-10-CM

## 2019-09-07 MED ORDER — NITROGLYCERIN 0.4 MG SL SUBL: 0.4000 mg | SUBLINGUAL_TABLET | SUBLINGUAL | 2 refills | Status: DC | PRN

## 2019-09-08 ENCOUNTER — Other Ambulatory Visit: Payer: Self-pay | Admitting: Cardiology

## 2019-09-08 ENCOUNTER — Other Ambulatory Visit: Payer: Self-pay | Admitting: Nurse Practitioner

## 2019-09-11 ENCOUNTER — Other Ambulatory Visit: Payer: Self-pay

## 2019-09-11 DIAGNOSIS — I214 Non-ST elevation (NSTEMI) myocardial infarction: Secondary | ICD-10-CM

## 2019-09-11 MED ORDER — ASPIRIN 81 MG PO TBEC: 81.0000 mg | DELAYED_RELEASE_TABLET | Freq: Every day | ORAL | 2 refills | Status: DC

## 2019-10-02 ENCOUNTER — Ambulatory Visit
Admission: RE | Admit: 2019-10-02 | Discharge: 2019-10-02 | Disposition: A | Discharge status: Home / Self Care | Payer: 59 | Source: Ambulatory Visit | Attending: Nurse Practitioner | Admitting: Nurse Practitioner

## 2019-10-02 ENCOUNTER — Other Ambulatory Visit: Payer: Self-pay

## 2019-10-02 DIAGNOSIS — Z1239 Encounter for other screening for malignant neoplasm of breast: Secondary | ICD-10-CM

## 2019-10-08 ENCOUNTER — Other Ambulatory Visit: Payer: Self-pay

## 2019-10-08 DIAGNOSIS — I10 Essential (primary) hypertension: Secondary | ICD-10-CM

## 2019-10-08 MED ORDER — METOPROLOL TARTRATE 25 MG PO TABS: 12.5000 mg | ORAL_TABLET | Freq: Two times a day (BID) | ORAL | 2 refills | Status: DC

## 2019-10-27 ENCOUNTER — Other Ambulatory Visit: Payer: Self-pay

## 2019-10-27 ENCOUNTER — Encounter: Payer: Self-pay | Admitting: Emergency Medicine

## 2019-10-27 ENCOUNTER — Ambulatory Visit
Admission: EM | Admit: 2019-10-27 | Discharge: 2019-10-27 | Disposition: A | Discharge status: Home / Self Care | Payer: 59 | Attending: Emergency Medicine | Admitting: Emergency Medicine

## 2019-10-27 DIAGNOSIS — Z113 Encounter for screening for infections with a predominantly sexual mode of transmission: Secondary | ICD-10-CM | POA: Insufficient documentation

## 2019-10-27 DIAGNOSIS — Z7251 High risk heterosexual behavior: Secondary | ICD-10-CM | POA: Diagnosis present

## 2019-10-27 DIAGNOSIS — B379 Candidiasis, unspecified: Secondary | ICD-10-CM

## 2019-10-27 MED ORDER — FLUCONAZOLE 200 MG PO TABS: 200.0000 mg | ORAL_TABLET | Freq: Once | ORAL | 0 refills | Status: AC

## 2019-10-27 NOTE — ED Notes (Signed)
Patient able to ambulate independently  

## 2019-10-27 NOTE — ED Triage Notes (Signed)
Pt presents to [facility] for assessment after finding out her current partner is cheating.  States she has some mild vaginal discharge, and slight spotting.  Denies lower abdominal pain outside of her normal.

## 2019-10-27 NOTE — ED Provider Notes (Signed)
[facility]    CSN: [id number] Arrival date & time: 10/27/19  0910      History   Chief Complaint Chief Complaint  Patient presents with  . Vaginal Discharge    HPI Lauren Carr is a 43 y.o. female presenting for STD screening.  States that she found out Saturday that her current sexual partner has been cheating.  Patient does have history of BV: Denies current flare.  Last treated for this by her gynecologist 1.5 months ago.  Patient has had some mild vaginal white discharge and malodor with associated pruritus.  Patient does report yeast infections after antibiotic use.  Patient states she completed course of Diflucan after finishing Flagyl 6 weeks ago.  Patient has not tried any OTC medications for her symptoms.   Past Medical History:  Diagnosis Date  . Hypertension   . NSTEMI (non-ST elevated myocardial infarction) ([place])   . Obstructive sleep apnea     Patient Active Problem List   Diagnosis Date Noted  . Chest pain of uncertain etiology [id number]  . H/O non-ST elevation myocardial infarction (NSTEMI) 04/30/2019  . ACS (acute coronary syndrome) ([place]) 04/19/2019  . NSTEMI (non-ST elevated myocardial infarction) ([place])   . OSA (obstructive sleep apnea) 11/24/2018  . Fibromyalgia 09/13/2018  . Allergic rhinitis 09/13/2018  . Essential hypertension 09/13/2018  . Abnormal glucose 09/13/2018  . Obesity 09/13/2018  . Vitamin D deficiency 09/13/2018  . Anxiety 09/13/2018  . Adjustment insomnia 08/27/2017  . Snoring 08/27/2017  . Heart palpitations 08/27/2017  . Diaphoresis 08/27/2017  . 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 CESAREAN SECTION    . CHOLECYSTECTOMY    . LEFT HEART CATH AND CORONARY ANGIOGRAPHY N/A 04/20/2019   Procedure: LEFT HEART CATH AND CORONARY ANGIOGRAPHY;  Surgeon: Nigel Mormon, MD;  Location: [facility];  Service: Cardiovascular;  Laterality: N/A;    OB  History   No obstetric history on file.      Home Medications    Prior to Admission medications   Medication Sig Start Date End Date Taking? Authorizing Provider  amLODipine (NORVASC) 5 MG tablet Take 1 tablet (5 mg total) by mouth daily. 08/03/19 11/01/19  Patwardhan, Reynold Bowen, MD  aspirin 81 MG EC tablet Take 1 tablet (81 mg total) by mouth daily. 09/11/19   Patwardhan, Reynold Bowen, MD  atorvastatin (LIPITOR) 80 MG tablet Take 1 tablet (80 mg total) by mouth daily at 6 PM. 04/20/19   Patwardhan, Manish J, MD  BRILINTA 90 MG TABS tablet TAKE 1 TABLET (90 MG TOTAL) BY MOUTH 2 (TWO) TIMES DAILY. 09/08/19   Patwardhan, Reynold Bowen, MD  cholecalciferol (VITAMIN D3) 25 MCG (1000 UT) tablet Take 3,000 Units by mouth daily.    [provider]  fluconazole (DIFLUCAN) 200 MG tablet Take 1 tablet (200 mg total) by mouth once for 1 dose. May repeat in 72 hours if needed 10/27/19 10/27/19  Hall-Potvin, [place], PA-C  hydrochlorothiazide (HYDRODIURIL) 12.5 MG tablet Take 1 tablet (12.5 mg total) by mouth daily. 06/01/19   [name], FNP  metoprolol tartrate (LOPRESSOR) 25 MG tablet Take 0.5 tablets (12.5 mg total) by mouth 2 (two) times daily. 10/08/19   Patwardhan, Reynold Bowen, MD  nitroGLYCERIN (NITROSTAT) 0.4 MG SL tablet Place 1 tablet (0.4 mg total) under the tongue every 5 (five) minutes x 3 doses as needed for chest pain. 09/07/19   Patwardhan, Reynold Bowen, MD    Family  History Family History  Problem Relation Age of Onset  . Breast cancer Mother   . Hypertension Mother   . Long QT syndrome Mother        Diagnosed in 67s after syncope. Has ICD placed  . Cancer - Prostate Father   . Hypertension Father   . Cancer - Cervical Maternal Grandmother   . Diabetes Maternal Grandmother   . CAD Maternal Grandfather   . CAD Paternal Grandfather     Social History Social History   Tobacco Use  . Smoking status: Never Smoker  . Smokeless tobacco: Never Used  Substance Use Topics  . Alcohol use:  Not Currently    Alcohol/week: 0.0 standard drinks  . Drug use: No     Allergies   Patient has no known allergies.   Review of Systems Review of Systems  Constitutional: Negative for fatigue and fever.  Respiratory: Negative for cough and shortness of breath.   Cardiovascular: Negative for chest pain and palpitations.  Gastrointestinal: Negative for constipation and diarrhea.  Genitourinary: Positive for vaginal discharge. Negative for dysuria, flank pain, frequency, hematuria, pelvic pain, urgency, vaginal bleeding and vaginal pain.     Physical Exam Triage Vital Signs ED Triage Vitals  Enc Vitals Group     BP 10/27/19 0919 (!) 126/91     Pulse Rate 10/27/19 0919 82     Resp 10/27/19 0919 18     Temp 10/27/19 0919 97.9 F (36.6 C)     Temp Source 10/27/19 0919 Temporal     SpO2 10/27/19 0919 95 %     Weight --      Height --      Head Circumference --      Peak Flow --      Pain Score 10/27/19 0920 0     Pain Loc --      Pain Edu? --      Excl. in [place]? --    No data found.  Updated Vital Signs BP (!) 126/91 (BP Location: Left Arm)   Pulse 82   Temp 97.9 F (36.6 C) (Temporal)   Resp 18   SpO2 95%   Visual Acuity Right Eye Distance:   Left Eye Distance:   Bilateral Distance:    Right Eye Near:   Left Eye Near:    Bilateral Near:     Physical Exam Constitutional:      General: She is not in acute distress. HENT:     Head: Normocephalic and atraumatic.  Eyes:     General: No scleral icterus.    Pupils: Pupils are equal, round, and reactive to light.  Cardiovascular:     Rate and Rhythm: Normal rate.  Pulmonary:     Effort: Pulmonary effort is normal.  Abdominal:     General: Bowel sounds are normal.     Palpations: Abdomen is soft.     Tenderness: There is no abdominal tenderness. There is no right CVA tenderness, left CVA tenderness or guarding.  Genitourinary:    Comments: Patient declined, self-swab performed Skin:    Coloration: Skin is  not jaundiced or pale.  Neurological:     Mental Status: She is alert and oriented to person, place, and time.      UC Treatments / Results  Labs (all labs ordered are listed, but only abnormal results are displayed) Labs Reviewed  RPR  HIV ANTIBODY (ROUTINE TESTING W REFLEX)  CERVICOVAGINAL ANCILLARY ONLY    EKG   Radiology No results found.  Procedures Procedures (including critical care time)  Medications Ordered in UC Medications - No data to display  Initial Impression / Assessment and Plan / UC Course  I have reviewed the triage vital signs and the nursing notes.  Pertinent labs & imaging results that were available during my care of the patient were reviewed by me and considered in my medical decision making (see chart for details).     STD panel pending, will treat for vaginal candidiasis given patient's history with Diflucan.  Patient to follow-up with OB/GYN regarding mild spotting, premenopausal concerns.  Low concern for PID at this time given lack of abdominal, pelvic, vaginal pain, fever.  Return precautions discussed, patient verbalized understanding and is agreeable to plan. Final Clinical Impressions(s) / UC Diagnoses   Final diagnoses:  Unprotected sex  Screening examination for venereal disease  Yeast infection     Discharge Instructions     Testing for chlamydia, gonorrhea, trichomonas is pending: please look for these results on the MyChart app/website.  We will notify you if you are positive and outline treatment at that time.  Important to avoid all forms of sexual intercourse (oral, vaginal, anal) with any/all partners for the next 7 days to avoid spreading/reinfecting. Any/all sexual partners should be notified of test results if positive.  Return for persistent/worsening symptoms or if you develop fever, abdominal or pelvic pain, blood in your urine, or are re-exposed to an STI.    ED Prescriptions    Medication Sig Dispense Auth.  Provider   fluconazole (DIFLUCAN) 200 MG tablet Take 1 tablet (200 mg total) by mouth once for 1 dose. May repeat in 72 hours if needed 2 tablet Hall-Potvin, [place], PA-C     PDMP not reviewed this encounter.   Neldon Mc [place], [place] 10/27/19 [phone]

## 2019-10-27 NOTE — Discharge Instructions (Addendum)
Testing for chlamydia, gonorrhea, trichomonas is pending: please look for these results on the MyChart app/website.  We will notify you if you are positive and outline treatment at that time.  Important to avoid all forms of sexual intercourse (oral, vaginal, anal) with any/all partners for the next 7 days to avoid spreading/reinfecting. Any/all sexual partners should be notified of test results if positive.  Return for persistent/worsening symptoms or if you develop fever, abdominal or pelvic pain, blood in your urine, or are re-exposed to an STI.

## 2019-10-28 LAB — RPR: RPR Ser Ql: NONREACTIVE

## 2019-10-28 LAB — HIV ANTIBODY (ROUTINE TESTING W REFLEX): HIV Screen 4th Generation wRfx: NONREACTIVE

## 2019-10-29 ENCOUNTER — Ambulatory Visit: Payer: 59 | Admitting: Nurse Practitioner

## 2019-10-29 LAB — CERVICOVAGINAL ANCILLARY ONLY
Chlamydia: NEGATIVE
Neisseria Gonorrhea: NEGATIVE
Trichomonas: NEGATIVE

## 2019-11-03 ENCOUNTER — Other Ambulatory Visit: Payer: Self-pay | Admitting: Nurse Practitioner

## 2019-11-04 ENCOUNTER — Ambulatory Visit: Payer: 59 | Admitting: Cardiology

## 2019-11-04 ENCOUNTER — Other Ambulatory Visit: Payer: Self-pay

## 2019-11-04 ENCOUNTER — Encounter: Payer: Self-pay | Admitting: Cardiology

## 2019-11-04 ENCOUNTER — Telehealth (INDEPENDENT_AMBULATORY_CARE_PROVIDER_SITE_OTHER): Payer: 59 | Admitting: Cardiology

## 2019-11-04 VITALS — Ht 69.0 in | Wt 232.0 lb

## 2019-11-04 DIAGNOSIS — I10 Essential (primary) hypertension: Secondary | ICD-10-CM | POA: Diagnosis not present

## 2019-11-04 DIAGNOSIS — R079 Chest pain, unspecified: Secondary | ICD-10-CM | POA: Diagnosis not present

## 2019-11-04 DIAGNOSIS — I252 Old myocardial infarction: Secondary | ICD-10-CM

## 2019-11-04 NOTE — Progress Notes (Addendum)
Virtual Visit via Video Note   Subjective:   Lauren Carr, female    DOB: 01-10-1976, 43 y.o.   MRN: [id number]  I connected with the patient on 11/04/2019 by a video enabled telemedicine application and verified that I am speaking with the correct person using two identifiers.     I discussed the limitations of evaluation and management by telemedicine and the availability of in person appointments. The patient expressed understanding and agreed to proceed.   This visit type was conducted due to national recommendations for restrictions regarding the COVID-19 Pandemic (e.g. social distancing).  This format is felt to be most appropriate for this patient at this time.  All issues noted in this document were discussed and addressed.  No physical exam was performed (except for noted visual exam findings with Tele health visits).  The patient has consented to conduct a Tele health visit and understands insurance will be billed.   Chief complaint:  Chest pain  43 y/o [place] American female with hypertension, hyperlipidemia, family h/o long QT syndrome, NSTEMI with no obstructive CAD on cath (03/2019)  Patient was admitted with chest pain in 03/2019. She was found to have trop elevation to 0.21 ng/mL. Coronary angiography on 04/20/2019 did not show any obstructive CAD. LVEDP was elevated at 22 mmHg. She was discharged on DAPT with Aspirin and Brilinta, along with high intensity statin, and low dose metoprolol. I continued baseline antihypertensive therapy. Patient had significant improvement in her shortness of breath since then.   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  She has not had any time to exercise regularly.  In the last 6 months, she has only taken nitroglycerin once.  She denies any chest pain, shortness of breath with exertion.  Blood pressure is usually well controlled, although she  does not have her vitals checked today.  LDL is remarkably improved discussed with the patient.   Past Medical History:  Diagnosis Date  . Hypertension   . NSTEMI (non-ST elevated myocardial infarction) ([place])   . Obstructive sleep apnea      Past Surgical History:  Procedure Laterality Date  . CESAREAN SECTION    . CHOLECYSTECTOMY    . LEFT HEART CATH AND CORONARY ANGIOGRAPHY N/A 04/20/2019   Procedure: LEFT HEART CATH AND CORONARY ANGIOGRAPHY;  Surgeon: Nigel Mormon, MD;  Location: [facility];  Service: Cardiovascular;  Laterality: N/A;     Social History   Socioeconomic History  . Marital status: Divorced    Spouse name: Not on file  . Number of children: 3  . Years of education: Not on file  . Highest education level: Not on file  Occupational History  . Not on file  Social Needs  . Financial resource strain: Not on file  . Food insecurity    Worry: Not on file    Inability: Not on file  . Transportation needs    Medical: Not on file    Non-medical: Not on file  Tobacco Use  . Smoking status: Never Smoker  . Smokeless tobacco: Never Used  Substance and Sexual Activity  . Alcohol use: Not Currently    Alcohol/week: 0.0 standard drinks  . Drug use: No  . Sexual activity: Not on file  Lifestyle  . Physical activity    Days per week: Not on file    Minutes per session: Not on file  . Stress:  Not on file  Relationships  . Social Herbalist on phone: Not on file    Gets together: Not on file    Attends religious service: Not on file    Active member of club or organization: Not on file    Attends meetings of clubs or organizations: Not on file    Relationship status: Not on file  . Intimate partner violence    Fear of current or ex partner: Not on file    Emotionally abused: Not on file    Physically abused: Not on file    Forced sexual activity: Not on file  Other Topics Concern  . Not on file  Social History Narrative  . Not  on file     Family History  Problem Relation Age of Onset  . Breast cancer Mother   . Hypertension Mother   . Long QT syndrome Mother        Diagnosed in 17s after syncope. Has ICD placed  . Cancer - Prostate Father   . Hypertension Father   . Cancer - Cervical Maternal Grandmother   . Diabetes Maternal Grandmother   . CAD Maternal Grandfather   . CAD Paternal Grandfather      Current Outpatient Medications on File Prior to Visit  Medication Sig Dispense Refill  . amLODipine (NORVASC) 5 MG tablet Take 1 tablet (5 mg total) by mouth daily. 30 tablet 3  . aspirin 81 MG EC tablet Take 1 tablet (81 mg total) by mouth daily. 60 tablet 2  . atorvastatin (LIPITOR) 80 MG tablet Take 1 tablet (80 mg total) by mouth daily at 6 PM. 60 tablet 3  . BRILINTA 90 MG TABS tablet TAKE 1 TABLET (90 MG TOTAL) BY MOUTH 2 (TWO) TIMES DAILY. 60 tablet 1  . cholecalciferol (VITAMIN D3) 25 MCG (1000 UT) tablet Take 3,000 Units by mouth daily.    . hydrochlorothiazide (HYDRODIURIL) 12.5 MG tablet Take 1 tablet (12.5 mg total) by mouth daily. 90 tablet 1  . metoprolol tartrate (LOPRESSOR) 25 MG tablet Take 0.5 tablets (12.5 mg total) by mouth 2 (two) times daily. 180 tablet 2  . nitroGLYCERIN (NITROSTAT) 0.4 MG SL tablet Place 1 tablet (0.4 mg total) under the tongue every 5 (five) minutes x 3 doses as needed for chest pain. 30 tablet 2   No current facility-administered medications on file prior to visit.     Cardiovascular studies:  EKG 08/03/2019: Sinus rhythm 78 bpm. 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  Nonspecific T-abnormality.   Normal QTc interval.  EKG 04/20/2019: Sinus rhythm 72 bpm. Possible old anteroseptal infarct  Coronary angiogram 04/20/2019: LM: Normal LAD: Medium caliber. Mid LAD myocardial bridge. Small diagonal branches. No significant stenoses. LCx: Small caliber. Normal RCA: Dominant. No stenoses LVEDP 22 mmHg  Impression: No angiographically evident coronary artery disease   Recommendation: One year DAPT for medical treatment of NSTEMI  Echocardiogram 04/19/2019: 1. Mild hypokinesis of the left ventricular, basal-mid inferolateral wall. 2. The left ventricle has normal systolic function, with an ejection fraction of 60-65%. Left ventricular diastolic function could not be evaluated. 3. The right ventricle has normal systolc function. The cavity was normal. 4. The aortic valve is tricuspid Aortic valve regurgitation is mild by color flow Doppler. 5. The aortic root and ascending aorta are normal in size and structure.   Recent labs: Results for JABREIA, DELACRUZ (MRN [id number]) as of 11/04/2019 17:08  Ref. Range 07/24/2018 09:00 04/19/2019 09:19 07/29/2019 10:54  Total CHOL/HDL Ratio Latest  Ref Range: 0.0 - 4.4 ratio  3.8 2.4  Cholesterol Latest Ref Range: 0 - 200 mg/dL 216 (A) 203 (H)   Cholesterol, Total Latest Ref Range: 100 - 199 mg/dL   128  HDL Cholesterol Latest Ref Range: >39 mg/dL 61 53 54  LDL (calc) Latest Ref Range: 0 - 99 mg/dL 124 125 (H) 54  LDL/HDL Ratio Unknown 2.0    Triglycerides Latest Ref Range: 0 - 149 mg/dL 154 125 98  VLDL Latest Ref Range: 0 - 40 mg/dL  25   VLDL Cholesterol Cal Latest Ref Range: 5 - 40 mg/dL   20     Review of Systems  Constitution: Negative for decreased appetite, malaise/fatigue, weight gain and weight loss.  HENT: Negative for congestion.   Eyes: Negative for visual disturbance.  Cardiovascular: Negative for chest pain, dyspnea on exertion, leg swelling, palpitations and syncope.  Respiratory: Negative for shortness of breath.   Endocrine: Negative for cold intolerance.  Hematologic/Lymphatic: Does not bruise/bleed easily.  Skin: Negative for itching and rash.  Musculoskeletal: Negative for myalgias.  Gastrointestinal: Negative for abdominal pain, nausea and vomiting.  Genitourinary: Negative for dysuria.  Neurological: Negative for dizziness and weakness.  Psychiatric/Behavioral: The patient is  not nervous/anxious.   All other systems reviewed and are negative.   Vitals:   08/03/19 1016  BP: 107/67  Pulse: 81  SpO2: 98%      Physical Exam  Constitutional: She is oriented to person, place, and time. She appears well-developed and well-nourished. No distress.  Pulmonary/Chest: Effort normal.  Neurological: She is alert and oriented to person, place, and time.  Psychiatric: She has a normal mood and affect.  Nursing note and vitals reviewed.            Assessment & Recommendations:   43 y/o [place] American female with hypertension, hyperlipidemia, family h/o long QT syndrome, NSTEMI with no obstructive CAD on cath (03/2019)  NSTEMI: 03/2020. No obstructive CAD on coronary angiogram. Continue DAPT for a 1 year (Aspirin/Brilinta) till 03/2020 Continue lipitor 80 mg daily, metoprolol 12.5 mg bid, amlodipine 5 mg daily.   Hypertension: Controlled.  Hyperlipidemia: Much improved on Lipitor 80 mg daily.  Physical activity recommendation (The Physical Activity Guidelines for Americans. JAMA 2018;Nov 12) At least 150-300 minutes a week of moderate-intensity, or 75-150 minutes a week of vigorous-intensity aerobic physical activity, or an equivalent combination of moderate- and vigorous-intensity aerobic activity. Adults should perform muscle-strengthening activities on 2 or more days a week. Older adults should do multicomponent physical activity that includes balance training as well as aerobic and muscle-strengthening activities. Benefits of increased physical activity include lower risk of mortality including cardiovascular mortality, lower risk of cardiovascular events and associated risk factors (hypertension and diabetes), and lower risk of many cancers (including bladder, breast, colon, endometrium, esophagus, kidney, lung, and stomach). Additional improvments have been seen in cognition, risk of dementia, anxiety and depression, improved bone health, lower risk of  falls, and associated injuries.  Dietary recommendation The 2019 ACC/AHA guidelines promote nutrition as a main fixture of cardiovascular wellness, with a recommendation for a varied diet of fruit, vegetables, fish, legumes, and whole grains (Class I), as well as recommendations to reduce sodium, cholesterol, processed meats, and refined sugars (Class IIa recommendation).10 Sodium intake, a topic of some controversy as of late, is recommended to be kept at 1,500 mg/day or less, far below the average daily intake in the [place] of 3,409 mg/day, and notably below that of previous [place] recommendations for 300mg /day.10,11  For those unable to reach 1,500 mg/day, they recommend at least a reduction of 1000 mg/day.  A Pesco-Mediterranean Diet With Intermittent Fasting: JACC Review Topic of the Week. J Am Coll Cardiol [id number] Pesco-Mediterranean diet, it is supplemented with extra-virgin olive oil (EVOO), which is the principle fat source, along with moderate amounts of dairy (particularly yogurt and cheese) and eggs, as well as modest amounts of alcohol consumption (ideally red wine with the evening meal), but few red and processed meats.   Follow up in 3 months   Charita Lindenberger Esther Hardy, MD [facility] Pager: [phone] Office: [phone]

## 2019-11-06 ENCOUNTER — Other Ambulatory Visit: Payer: Self-pay | Admitting: Cardiology

## 2019-11-10 ENCOUNTER — Other Ambulatory Visit: Payer: Self-pay | Admitting: Nurse Practitioner

## 2019-11-11 ENCOUNTER — Encounter (HOSPITAL_COMMUNITY): Payer: Self-pay | Admitting: *Deleted

## 2019-11-11 NOTE — Progress Notes (Signed)
Pt discharged from [facility] d/t completion of 13 week program.  Last exercise 08/24/2019.

## 2019-11-12 ENCOUNTER — Encounter (HOSPITAL_COMMUNITY)
Admission: RE | Admit: 2019-11-12 | Discharge: 2019-11-12 | Disposition: A | Discharge status: Home / Self Care | Payer: 59 | Source: Ambulatory Visit | Attending: Cardiology | Admitting: Cardiology

## 2019-11-12 ENCOUNTER — Other Ambulatory Visit: Payer: Self-pay

## 2019-12-02 ENCOUNTER — Other Ambulatory Visit: Payer: Self-pay | Admitting: Cardiology

## 2019-12-02 DIAGNOSIS — R079 Chest pain, unspecified: Secondary | ICD-10-CM

## 2019-12-12 ENCOUNTER — Other Ambulatory Visit: Payer: Self-pay | Admitting: Nurse Practitioner

## 2020-01-02 ENCOUNTER — Other Ambulatory Visit: Payer: Self-pay | Admitting: Cardiology

## 2020-01-20 ENCOUNTER — Other Ambulatory Visit: Payer: Self-pay

## 2020-01-20 DIAGNOSIS — I252 Old myocardial infarction: Secondary | ICD-10-CM

## 2020-01-20 MED ORDER — ATORVASTATIN CALCIUM 80 MG PO TABS: 80.0000 mg | ORAL_TABLET | Freq: Every day | ORAL | 3 refills | Status: DC

## 2020-01-30 ENCOUNTER — Other Ambulatory Visit: Payer: Self-pay | Admitting: Cardiology

## 2020-01-30 DIAGNOSIS — E782 Mixed hyperlipidemia: Secondary | ICD-10-CM

## 2020-01-30 DIAGNOSIS — I252 Old myocardial infarction: Secondary | ICD-10-CM

## 2020-01-30 MED ORDER — ATORVASTATIN CALCIUM 80 MG PO TABS: 80.0000 mg | ORAL_TABLET | Freq: Every day | ORAL | 1 refills | Status: DC

## 2020-01-31 ENCOUNTER — Other Ambulatory Visit: Payer: Self-pay | Admitting: Cardiology

## 2020-01-31 DIAGNOSIS — I214 Non-ST elevation (NSTEMI) myocardial infarction: Secondary | ICD-10-CM

## 2020-02-22 ENCOUNTER — Other Ambulatory Visit: Payer: Self-pay | Admitting: Nurse Practitioner

## 2020-02-28 ENCOUNTER — Ambulatory Visit: Payer: 59 | Attending: Internal Medicine

## 2020-02-28 DIAGNOSIS — Z23 Encounter for immunization: Secondary | ICD-10-CM | POA: Insufficient documentation

## 2020-02-28 NOTE — Progress Notes (Signed)
   Covid-19 Vaccination Clinic  Name:  Lauren Carr    MRN: [id number] DOB: 15-Sep-1976  02/28/2020  [name] was observed post Covid-19 immunization for 15 minutes without incident. She was provided with Vaccine Information Sheet and instruction to access the V-Safe system.   [name] was instructed to call 911 with any severe reactions post vaccine: Marland Kitchen Difficulty breathing  . Swelling of face and throat  . A fast heartbeat  . A bad rash all over body  . Dizziness and weakness   Immunizations Administered    Name Date Dose VIS Date Route   Pfizer COVID-19 Vaccine 02/28/2020  5:03 PM 0.3 mL 12/04/2019 Intramuscular   Manufacturer: [place]   Lot: [id number]   [place]: [id number]

## 2020-03-04 ENCOUNTER — Ambulatory Visit: Payer: 59

## 2020-03-29 ENCOUNTER — Ambulatory Visit: Payer: 59 | Attending: Internal Medicine

## 2020-03-29 DIAGNOSIS — Z23 Encounter for immunization: Secondary | ICD-10-CM

## 2020-03-29 NOTE — Progress Notes (Signed)
   Covid-19 Vaccination Clinic  Name:  Lauren Carr    MRN: [id number] DOB: Nov 11, 1976  03/29/2020  [name] was observed post Covid-19 immunization for 15 minutes without incident. She was provided with Vaccine Information Sheet and instruction to access the V-Safe system.   [name] was instructed to call 911 with any severe reactions post vaccine: Marland Kitchen Difficulty breathing  . Swelling of face and throat  . A fast heartbeat  . A bad rash all over body  . Dizziness and weakness   Immunizations Administered    Name Date Dose VIS Date Route   Pfizer COVID-19 Vaccine 03/29/2020  2:52 PM 0.3 mL 12/04/2019 Intramuscular   Manufacturer: [place]   Lot: [id number]   [place]: [id number]

## 2020-05-05 ENCOUNTER — Ambulatory Visit: Payer: 59 | Admitting: Cardiology

## 2020-05-10 ENCOUNTER — Other Ambulatory Visit: Payer: Self-pay | Admitting: Nurse Practitioner

## 2020-05-15 ENCOUNTER — Other Ambulatory Visit: Payer: Self-pay | Admitting: Cardiology

## 2020-05-20 ENCOUNTER — Ambulatory Visit: Payer: 59 | Admitting: Cardiology

## 2020-06-14 DIAGNOSIS — E782 Mixed hyperlipidemia: Secondary | ICD-10-CM | POA: Insufficient documentation

## 2020-06-14 NOTE — Progress Notes (Signed)
Subjective:   Lauren Carr, female    DOB: January 28, 1976, 44 y.o.   MRN: [id number]   Chief complaint:  Chest pain  44 y/o [place] American female with hypertension, hyperlipidemia, family h/o long QT syndrome, NSTEMI with no obstructive CAD on cath (03/2019)  Patient was admitted with chest pain in 03/2019. She was found to have trop elevation to 0.21 ng/mL. Coronary angiography on 04/20/2019 did not show any obstructive CAD. LVEDP was elevated at 22 mmHg. She was discharged on DAPT with Aspirin and Brilinta, along with high intensity statin, and low dose metoprolol. I continued baseline antihypertensive therapy. Patient had significant improvement in her shortness of breath since then.   She has been doing well and denies chest pain, shortness of breath, palpitations, leg edema, orthopnea, PND, TIA/syncope. Blood pressure is well controlled.   Current Outpatient Medications on File Prior to Visit  Medication Sig Dispense Refill  . amLODipine (NORVASC) 5 MG tablet TAKE 1 TABLET BY MOUTH EVERY DAY 90 tablet 3  . aspirin 81 MG EC tablet TAKE 1 TABLET BY MOUTH EVERY DAY 30 tablet 5  . atorvastatin (LIPITOR) 80 MG tablet Take 1 tablet (80 mg total) by mouth daily at 6 PM. 90 tablet 1  . BRILINTA 90 MG TABS tablet TAKE 1 TABLET (90 MG TOTAL) BY MOUTH 2 (TWO) TIMES DAILY. 60 tablet 5  . cholecalciferol (VITAMIN D3) 25 MCG (1000 UT) tablet Take 3,000 Units by mouth daily.    . hydrochlorothiazide (HYDRODIURIL) 12.5 MG tablet TAKE 1 TABLET BY MOUTH EVERY DAY 30 tablet 2  . metoprolol tartrate (LOPRESSOR) 25 MG tablet Take 0.5 tablets (12.5 mg total) by mouth 2 (two) times daily. 180 tablet 2  . nitroGLYCERIN (NITROSTAT) 0.4 MG SL tablet Place 1 tablet (0.4 mg total) under the tongue every 5 (five) minutes x 3 doses as needed for chest pain. 30 tablet 2   No current facility-administered medications on file prior to visit.    Cardiovascular studies:  EKG 06/15/2020: Sinus rhythm 75  bpm Cannot exclude old anteroseptal infarct  Coronary angiogram 04/20/2019: LM: Normal LAD: Medium caliber. Mid LAD myocardial bridge. Small diagonal branches. No significant stenoses. LCx: Small caliber. Normal RCA: Dominant. No stenoses LVEDP 22 mmHg  Impression: No angiographically evident coronary artery disease  Recommendation: One year DAPT for medical treatment of NSTEMI  Echocardiogram 04/19/2019: 1. Mild hypokinesis of the left ventricular, basal-mid inferolateral wall. 2. The left ventricle has normal systolic function, with an ejection fraction of 60-65%. Left ventricular diastolic function could not be evaluated. 3. The right ventricle has normal systolc function. The cavity was normal. 4. The aortic valve is tricuspid Aortic valve regurgitation is mild by color flow Doppler. 5. The aortic root and ascending aorta are normal in size and structure.   Recent labs: 07/29/2019: Glucose 95, BUN/Cr 12/0.98. EGFR 81. Na/K 136/4.0.  H/H 11/35. MCV 83. Platelets 256 HbA1C 5.7% Chol 128, TG 98, HDL 54, LDL 54 TSH 1.8 normal      Review of Systems  Cardiovascular: Negative for chest pain, dyspnea on exertion, leg swelling, palpitations and syncope.    Vitals:   06/15/20 0902  BP: 115/72  Pulse: 76  Resp: 17  SpO2: 99%     Physical Exam Vitals and nursing note reviewed.  Constitutional:      General: She is not in acute distress. Neck:     Vascular: No JVD.  Cardiovascular:     Rate and Rhythm: Normal rate and regular  rhythm.     Pulses: Intact distal pulses.     Heart sounds: Normal heart sounds. No murmur heard.   Pulmonary:     Effort: Pulmonary effort is normal.     Breath sounds: Normal breath sounds. No wheezing or rales.              Assessment & Recommendations:   44 y/o [place] American female with hypertension, hyperlipidemia, family h/o long QT syndrome, NSTEMI with no obstructive CAD on cath (03/2019)  H/o NSTEMI 03/2020.  No obstructive CAD on coronary angiogram. Stop Brilinta. Continue Aspirin 81 mg. Continue lipitor 80 mg daily, metoprolol 12.5 mg bid, amlodipine 5 mg daily.   Hypertension: Controlled.  Hyperlipidemia: Continue lipitor 80 mg. Check labs with PCP.  F/u in 1 year  Nigel Mormon, MD [facility] Pager: [phone] Office: [phone]

## 2020-06-15 ENCOUNTER — Other Ambulatory Visit: Payer: Self-pay

## 2020-06-15 ENCOUNTER — Encounter: Payer: Self-pay | Admitting: Cardiology

## 2020-06-15 ENCOUNTER — Ambulatory Visit: Payer: 59 | Admitting: Cardiology

## 2020-06-15 VITALS — BP 115/72 | HR 76 | Resp 17 | Ht 69.0 in | Wt 240.0 lb

## 2020-06-15 DIAGNOSIS — E782 Mixed hyperlipidemia: Secondary | ICD-10-CM

## 2020-06-15 DIAGNOSIS — I10 Essential (primary) hypertension: Secondary | ICD-10-CM

## 2020-06-15 DIAGNOSIS — R079 Chest pain, unspecified: Secondary | ICD-10-CM

## 2020-06-15 DIAGNOSIS — I252 Old myocardial infarction: Secondary | ICD-10-CM

## 2020-08-01 ENCOUNTER — Encounter: Payer: 59 | Admitting: Nurse Practitioner

## 2020-08-11 ENCOUNTER — Other Ambulatory Visit: Payer: Self-pay | Admitting: Nurse Practitioner

## 2020-08-16 ENCOUNTER — Other Ambulatory Visit: Payer: Self-pay | Admitting: Cardiology

## 2020-08-16 DIAGNOSIS — I214 Non-ST elevation (NSTEMI) myocardial infarction: Secondary | ICD-10-CM

## 2020-09-13 ENCOUNTER — Encounter: Payer: Self-pay | Admitting: Nurse Practitioner

## 2020-09-13 ENCOUNTER — Other Ambulatory Visit: Payer: Self-pay

## 2020-09-13 ENCOUNTER — Ambulatory Visit: Payer: 59 | Admitting: Nurse Practitioner

## 2020-09-13 VITALS — BP 122/78 | HR 73 | Temp 97.8°F | Ht 69.0 in | Wt 245.2 lb

## 2020-09-13 DIAGNOSIS — Z0000 Encounter for general adult medical examination without abnormal findings: Secondary | ICD-10-CM

## 2020-09-13 DIAGNOSIS — Z1231 Encounter for screening mammogram for malignant neoplasm of breast: Secondary | ICD-10-CM | POA: Diagnosis not present

## 2020-09-13 DIAGNOSIS — R609 Edema, unspecified: Secondary | ICD-10-CM | POA: Diagnosis not present

## 2020-09-13 DIAGNOSIS — I10 Essential (primary) hypertension: Secondary | ICD-10-CM

## 2020-09-13 DIAGNOSIS — E559 Vitamin D deficiency, unspecified: Secondary | ICD-10-CM

## 2020-09-13 DIAGNOSIS — I252 Old myocardial infarction: Secondary | ICD-10-CM

## 2020-09-13 DIAGNOSIS — E782 Mixed hyperlipidemia: Secondary | ICD-10-CM | POA: Diagnosis not present

## 2020-09-13 DIAGNOSIS — R0602 Shortness of breath: Secondary | ICD-10-CM

## 2020-09-13 DIAGNOSIS — R7309 Other abnormal glucose: Secondary | ICD-10-CM

## 2020-09-13 LAB — POCT URINALYSIS DIPSTICK
Bilirubin, UA: NEGATIVE
Blood, UA: NEGATIVE
Glucose, UA: NEGATIVE
Ketones, UA: NEGATIVE
Leukocytes, UA: NEGATIVE
Nitrite, UA: NEGATIVE
Protein, UA: POSITIVE — AB
Spec Grav, UA: >=1.03 — AB (ref 1.010–1.025)
Urobilinogen, UA: 0.2 E.U./dL
pH, UA: 6.5 (ref 5.0–8.0)

## 2020-09-13 LAB — POCT UA - MICROALBUMIN
Creatinine, POC: 300 mg/dL
Microalbumin Ur, POC: 150 mg/L

## 2020-09-13 NOTE — Progress Notes (Signed)
I,Yamilka Roman [employer] as a Education administrator for [employer], FNP.,have documented all relevant documentation on the behalf of [name], FNP,as directed by  [name], FNP while in the presence of [name], [place].  This visit occurred during the SARS-CoV-2 public health emergency.  Safety protocols were in place, including screening questions prior to the visit, additional usage of staff PPE, and extensive cleaning of exam room while observing appropriate contact time as indicated for disinfecting solutions.  Subjective:     Patient ID: Lauren Carr , female    DOB: Apr 27, 1976 , 44 y.o.   MRN: [id number]   Chief Complaint  Patient presents with  . Annual Exam    HPI  Patient here for HM. She is going in to the office in June - she is vaccinated in April - she was diagnosed in August 18th, she had congestion, severe body aches, loss sense of taste and smell.  Low grade fever.  She did not have an appetite.  She had lost 5lbs and had sore throat.  She went to [facility] for her covid test. Her entire family had covid - her grandmother and mother, her children and her uncle.   Wt Readings from Last 3 Encounters: 09/13/20 : 245 lb 3.2 oz (111.2 kg) 06/15/20 : 240 lb (108.9 kg) 11/04/19 : 232 lb (105.2 kg)  She is recently engaged.       Past Medical History:  Diagnosis Date  . Hypertension   . NSTEMI (non-ST elevated myocardial infarction) ([place])   . Obstructive sleep apnea      Family History  Problem Relation Age of Onset  . Breast cancer Mother        early 68s  . Hypertension Mother   . Long QT syndrome Mother        Diagnosed in 108s after syncope. Has ICD placed  . Cancer - Prostate Father   . Hypertension Father   . Cancer - Cervical Maternal Grandmother   . Diabetes Maternal Grandmother   . CAD Maternal Grandfather   . CAD Paternal Grandfather      Current Outpatient Medications:  .  aspirin 81 MG EC tablet, TAKE 1 TABLET BY MOUTH EVERY DAY,  Disp: 30 tablet, Rfl: 5 .  atorvastatin (LIPITOR) 80 MG tablet, Take 1 tablet (80 mg total) by mouth daily at 6 PM., Disp: 90 tablet, Rfl: 1 .  cholecalciferol (VITAMIN D3) 25 MCG (1000 UT) tablet, Take 3,000 Units by mouth daily., Disp: , Rfl:  .  hydrochlorothiazide (HYDRODIURIL) 12.5 MG tablet, TAKE 1 TABLET BY MOUTH EVERY DAY, Disp: 30 tablet, Rfl: 2 .  metoprolol tartrate (LOPRESSOR) 25 MG tablet, Take 0.5 tablets (12.5 mg total) by mouth 2 (two) times daily., Disp: 180 tablet, Rfl: 2 .  nitroGLYCERIN (NITROSTAT) 0.4 MG SL tablet, Place 1 tablet (0.4 mg total) under the tongue every 5 (five) minutes x 3 doses as needed for chest pain., Disp: 30 tablet, Rfl: 2 .  amLODipine (NORVASC) 5 MG tablet, TAKE 1 TABLET BY MOUTH EVERY DAY, Disp: 30 tablet, Rfl: 11 .  fluconazole (DIFLUCAN) 100 MG tablet, Take 1 tablet (100 mg total) by mouth daily. Take 1 tablet by mouth now repeat in 5 days, Disp: 2 tablet, Rfl: 0 .  tinidazole (TINDAMAX) 500 MG tablet, Take two tablets by mouth once daily x 5 days, Disp: 10 tablet, Rfl: 0   No Known Allergies    The patient states she uses tubal ligation for birth control.  She  has a menstrual cycle.  Her PAP was due last month but she is spotting today, will come back if can not get in with Dr. Garwin Brothers by the end of the year.  Negative for Dysmenorrhea and Negative for Menorrhagia. Negative for: breast discharge, breast lump(s), breast pain and breast self exam. Associated symptoms include abnormal vaginal bleeding. Pertinent negatives include abnormal bleeding (hematology), anxiety, decreased libido, depression, difficulty falling sleep, dyspareunia, history of infertility, nocturia, sexual dysfunction, sleep disturbances, urinary incontinence, urinary urgency, vaginal discharge and vaginal itching. Diet regular.The patient states her exercise level is minimal right now - she was working out with a trainer - Mardelle Matte.  She is no longer taking the weight injection.    The patient's tobacco use is:  Social History   Tobacco Use  Smoking Status Never Smoker  Smokeless Tobacco Never Used   She has been exposed to passive smoke. The patient's alcohol use is:  Social History   Substance and Sexual Activity  Alcohol Use Not Currently  . Alcohol/week: 0.0 standard drinks     Review of Systems  Constitutional: Negative.   HENT: Negative.   Eyes: Negative.   Respiratory: Negative.   Cardiovascular: Negative.   Gastrointestinal: Negative.   Endocrine: Negative.   Genitourinary: Negative.   Musculoskeletal: Negative.   Skin: Negative.   Allergic/Immunologic: Negative.   Neurological: Negative.   Hematological: Negative.   Psychiatric/Behavioral: Negative.      Today's Vitals   09/13/20 1521  BP: 122/78  Pulse: 73  Temp: 97.8 F (36.6 C)  TempSrc: Oral  Weight: 245 lb 3.2 oz (111.2 kg)  Height: '5\' 9"'  (1.753 m)  PainSc: 0-No pain   Body mass index is 36.21 kg/m.   Objective:  Physical Exam Constitutional:      General: She is not in acute distress.    Appearance: Normal appearance. She is well-developed. She is obese.  HENT:     Head: Normocephalic and atraumatic.     Right Ear: Hearing, tympanic membrane, ear canal and external ear normal. There is no impacted cerumen.     Left Ear: Hearing, tympanic membrane, ear canal and external ear normal. There is no impacted cerumen.     Nose:     Comments: Deferred- masked    Mouth/Throat:     Comments: Deferred - masked Eyes:     General: Lids are normal.     Extraocular Movements: Extraocular movements intact.     Conjunctiva/sclera: Conjunctivae normal.     Pupils: Pupils are equal, round, and reactive to light.     Funduscopic exam:    Right eye: No papilledema.        Left eye: No papilledema.  Neck:     Thyroid: No thyroid mass.     Vascular: No carotid bruit.  Cardiovascular:     Rate and Rhythm: Normal rate and regular rhythm.     Pulses: Normal pulses.     Heart  sounds: Normal heart sounds. No murmur heard.   Pulmonary:     Effort: Pulmonary effort is normal.     Breath sounds: Normal breath sounds.  Abdominal:     General: Abdomen is flat. Bowel sounds are normal. There is no distension.     Palpations: Abdomen is soft.     Tenderness: There is no abdominal tenderness.  Genitourinary:    Rectum: Guaiac result negative.  Musculoskeletal:        General: No swelling. Normal range of motion.     Cervical  back: Full passive range of motion without pain, normal range of motion and neck supple.     Right lower leg: Edema (trace) present.     Left lower leg: No edema (trace).  Skin:    General: Skin is warm and dry.     Capillary Refill: Capillary refill takes less than 2 seconds.  Neurological:     General: No focal deficit present.     Mental Status: She is alert and oriented to person, place, and time.     Cranial Nerves: No cranial nerve deficit.     Sensory: No sensory deficit.  Psychiatric:        Mood and Affect: Mood normal.        Behavior: Behavior normal.        Thought Content: Thought content normal.        Judgment: Judgment normal.         Assessment And Plan:     1. Essential hypertension . B/P is controlled.  . CMP ordered to check renal function.  . The importance of regular exercise and dietary modification was stressed to the patient.  . She is being followed by Cardiology - POCT Urinalysis Dipstick (81002) - POCT UA - Microalbumin - CMP14+EGFR  2. Encounter for general adult medical examination w/o abnormal findings . Behavior modifications discussed and diet history reviewed.   . Pt will continue to exercise regularly and modify diet with low GI, plant based foods and decrease intake of processed foods.  . Recommend intake of daily multivitamin, Vitamin D, and calcium.  . Recommend mammogram for preventive screenings, as well as recommend immunizations that include influenza, TDAP - CBC  3. Mixed  hyperlipidemia  Chronic, controlled  Continue with current medications, tolerating statins well - CMP14+EGFR - Lipid panel  4. Vitamin D deficiency  Will check vitamin D level and supplement as needed.     Also encouraged to spend 15 minutes in the sun daily.   5. Encounter for screening mammogram for malignant neoplasm of breast  Pt instructed on Self Breast Exam.According to ACOG guidelines Women aged 32 and older are recommended to get an annual mammogram. Referral placed to [place] for appointment scheduing.   Pt encouraged to get annual mammogram - MM DIGITAL SCREENING BILATERAL; Future  6. Swelling  She is having lower extremity swelling to her feet with trace edema  Will check BNP  Encouraged to keep elevated and wear support socks - Brain natriuretic peptide  7. Shortness of breath  No abnormal breath sounds however having intermittent shortness of breath  Will check BNP  If worsens needs to go to ER for further evaluation - Brain natriuretic peptide  8. Abnormal glucose  Chronic, stable  Avoid foods high in sugar and starches  No current medications - Hemoglobin A1c - TSH  9. History of MI (myocardial infarction) Being followed by Cardiology    Patient was given opportunity to ask questions. Patient verbalized understanding of the plan and was able to repeat key elements of the plan. All questions were answered to their satisfaction.   [name], FNP   I, [name], FNP, have reviewed all documentation for this visit. The documentation on 10/13/20 for the exam, diagnosis, procedures, and orders are all accurate and complete.  THE PATIENT IS ENCOURAGED TO PRACTICE SOCIAL DISTANCING DUE TO THE COVID-19 PANDEMIC.

## 2020-09-13 NOTE — Patient Instructions (Signed)
Health Maintenance, Female Adopting a healthy lifestyle and getting preventive care are important in promoting health and wellness. Ask your health care provider about:  The right schedule for you to have regular tests and exams.  Things you can do on your own to prevent diseases and keep yourself healthy. What should I know about diet, weight, and exercise? Eat a healthy diet   Eat a diet that includes plenty of vegetables, fruits, low-fat dairy products, and lean protein.  Do not eat a lot of foods that are high in solid fats, added sugars, or sodium. Maintain a healthy weight Body mass index (BMI) is used to identify weight problems. It estimates body fat based on height and weight. Your health care provider can help determine your BMI and help you achieve or maintain a healthy weight. Get regular exercise Get regular exercise. This is one of the most important things you can do for your health. Most adults should:  Exercise for at least 150 minutes each week. The exercise should increase your heart rate and make you sweat (moderate-intensity exercise).  Do strengthening exercises at least twice a week. This is in addition to the moderate-intensity exercise.  Spend less time sitting. Even light physical activity can be beneficial. Watch cholesterol and blood lipids Have your blood tested for lipids and cholesterol at 46 years of age, then have this test every 5 years. Have your cholesterol levels checked more often if:  Your lipid or cholesterol levels are high.  You are older than 46 years of age.  You are at high risk for heart disease. What should I know about cancer screening? Depending on your health history and family history, you may need to have cancer screening at various ages. This may include screening for:  Breast cancer.  Cervical cancer.  Colorectal cancer.  Skin cancer.  Lung cancer. What should I know about heart disease, diabetes, and high blood  pressure? Blood pressure and heart disease  High blood pressure causes heart disease and increases the risk of stroke. This is more likely to develop in people who have high blood pressure readings, are of African descent, or are overweight.  Have your blood pressure checked: ? Every 3-5 years if you are 18-39 years of age. ? Every year if you are 40 years old or older. Diabetes Have regular diabetes screenings. This checks your fasting blood sugar level. Have the screening done:  Once every three years after age 40 if you are at a normal weight and have a low risk for diabetes.  More often and at a younger age if you are overweight or have a high risk for diabetes. What should I know about preventing infection? Hepatitis B If you have a higher risk for hepatitis B, you should be screened for this virus. Talk with your health care provider to find out if you are at risk for hepatitis B infection. Hepatitis C Testing is recommended for:  Everyone born from 1945 through 1965.  Anyone with known risk factors for hepatitis C. Sexually transmitted infections (STIs)  Get screened for STIs, including gonorrhea and chlamydia, if: ? You are sexually active and are younger than 46 years of age. ? You are older than 46 years of age and your health care provider tells you that you are at risk for this type of infection. ? Your sexual activity has changed since you were last screened, and you are at increased risk for chlamydia or gonorrhea. Ask your health care provider if   you are at risk.  Ask your health care provider about whether you are at high risk for HIV. Your health care provider may recommend a prescription medicine to help prevent HIV infection. If you choose to take medicine to prevent HIV, you should first get tested for HIV. You should then be tested every 3 months for as long as you are taking the medicine. Pregnancy  If you are about to stop having your period (premenopausal) and  you may become pregnant, seek counseling before you get pregnant.  Take 400 to 800 micrograms (mcg) of folic acid every day if you become pregnant.  Ask for birth control (contraception) if you want to prevent pregnancy. Osteoporosis and menopause Osteoporosis is a disease in which the bones lose minerals and strength with aging. This can result in bone fractures. If you are 65 years old or older, or if you are at risk for osteoporosis and fractures, ask your health care provider if you should:  Be screened for bone loss.  Take a calcium or vitamin D supplement to lower your risk of fractures.  Be given hormone replacement therapy (HRT) to treat symptoms of menopause. Follow these instructions at home: Lifestyle  Do not use any products that contain nicotine or tobacco, such as cigarettes, e-cigarettes, and chewing tobacco. If you need help quitting, ask your health care provider.  Do not use street drugs.  Do not share needles.  Ask your health care provider for help if you need support or information about quitting drugs. Alcohol use  Do not drink alcohol if: ? Your health care provider tells you not to drink. ? You are pregnant, may be pregnant, or are planning to become pregnant.  If you drink alcohol: ? Limit how much you use to 0-1 drink a day. ? Limit intake if you are breastfeeding.  Be aware of how much alcohol is in your drink. In the [place] one drink equals one 12 oz bottle of beer (355 mL), one 5 oz glass of wine (148 mL), or one 1 oz glass of hard liquor (44 mL). General instructions  Schedule regular health, dental, and eye exams.  Stay current with your vaccines.  Tell your health care provider if: ? You often feel depressed. ? You have ever been abused or do not feel safe at home. Summary  Adopting a healthy lifestyle and getting preventive care are important in promoting health and wellness.  Follow your health care provider's instructions about healthy  diet, exercising, and getting tested or screened for diseases.  Follow your health care provider's instructions on monitoring your cholesterol and blood pressure. This information is not intended to replace advice given to you by your health care provider. Make sure you discuss any questions you have with your health care provider. Document Revised: 12/03/2018 Document Reviewed: 12/03/2018 Elsevier Patient Education  2020 [employer]  

## 2020-09-14 ENCOUNTER — Telehealth: Payer: Self-pay

## 2020-09-14 LAB — CMP14+EGFR
ALT: 24 IU/L (ref 0–32)
AST: 22 IU/L (ref 0–40)
Albumin/Globulin Ratio: 1.6 (ref 1.2–2.2)
Albumin: 4.7 g/dL (ref 3.8–4.8)
Alkaline Phosphatase: 74 IU/L (ref 44–121)
BUN/Creatinine Ratio: 13 (ref 9–23)
BUN: 10 mg/dL (ref 6–24)
Bilirubin Total: 0.5 mg/dL (ref 0.0–1.2)
CO2: 23 mmol/L (ref 20–29)
Calcium: 9.9 mg/dL (ref 8.7–10.2)
Chloride: 101 mmol/L (ref 96–106)
Creatinine, Ser: 0.75 mg/dL (ref 0.57–1.00)
GFR calc Af Amer: 111 mL/min/{1.73_m2} (ref >59)
GFR calc non Af Amer: 96 mL/min/{1.73_m2} (ref >59)
Globulin, Total: 3 g/dL (ref 1.5–4.5)
Glucose: 92 mg/dL (ref 65–99)
Potassium: 4.2 mmol/L (ref 3.5–5.2)
Sodium: 138 mmol/L (ref 134–144)
Total Protein: 7.7 g/dL (ref 6.0–8.5)

## 2020-09-14 LAB — LIPID PANEL
Chol/HDL Ratio: 2.8 ratio (ref 0.0–4.4)
Cholesterol, Total: 158 mg/dL (ref 100–199)
HDL: 57 mg/dL (ref >39)
LDL Chol Calc (NIH): 68 mg/dL (ref 0–99)
Triglycerides: 201 mg/dL — ABNORMAL HIGH (ref 0–149)
VLDL Cholesterol Cal: 33 mg/dL (ref 5–40)

## 2020-09-14 LAB — CBC
Hematocrit: 35.6 % (ref 34.0–46.6)
Hemoglobin: 11.9 g/dL (ref 11.1–15.9)
MCH: 26.7 pg (ref 26.6–33.0)
MCHC: 33.4 g/dL (ref 31.5–35.7)
MCV: 80 fL (ref 79–97)
Platelets: 303 10*3/uL (ref 150–450)
RBC: 4.46 x10E6/uL (ref 3.77–5.28)
RDW: 15.2 % (ref 11.7–15.4)
WBC: 6.4 10*3/uL (ref 3.4–10.8)

## 2020-09-14 LAB — HEMOGLOBIN A1C
Est. average glucose Bld gHb Est-mCnc: 120 mg/dL
Hgb A1c MFr Bld: 5.8 % — ABNORMAL HIGH (ref 4.8–5.6)

## 2020-09-14 LAB — BRAIN NATRIURETIC PEPTIDE: BNP: 29.6 pg/mL (ref 0.0–100.0)

## 2020-09-14 LAB — TSH: TSH: 1.15 u[IU]/mL (ref 0.450–4.500)

## 2020-09-14 NOTE — Telephone Encounter (Signed)
Called pt to give her lab results she stated you was going to put in a order for her to have another ultrasound of her thyroid.

## 2020-09-15 ENCOUNTER — Other Ambulatory Visit: Payer: Self-pay | Admitting: Nurse Practitioner

## 2020-09-15 DIAGNOSIS — E042 Nontoxic multinodular goiter: Secondary | ICD-10-CM

## 2020-09-15 NOTE — Telephone Encounter (Signed)
I have placed the order

## 2020-09-20 ENCOUNTER — Other Ambulatory Visit: Payer: Self-pay | Admitting: Cardiology

## 2020-09-20 DIAGNOSIS — R079 Chest pain, unspecified: Secondary | ICD-10-CM

## 2020-09-23 ENCOUNTER — Ambulatory Visit
Admission: RE | Admit: 2020-09-23 | Discharge: 2020-09-23 | Disposition: A | Discharge status: Home / Self Care | Payer: 59 | Source: Ambulatory Visit | Attending: Nurse Practitioner | Admitting: Nurse Practitioner

## 2020-09-23 DIAGNOSIS — E042 Nontoxic multinodular goiter: Secondary | ICD-10-CM

## 2020-10-04 ENCOUNTER — Ambulatory Visit: Payer: 59

## 2020-10-04 ENCOUNTER — Ambulatory Visit (INDEPENDENT_AMBULATORY_CARE_PROVIDER_SITE_OTHER): Payer: 59 | Admitting: Nurse Practitioner

## 2020-10-04 ENCOUNTER — Other Ambulatory Visit: Payer: Self-pay

## 2020-10-04 ENCOUNTER — Encounter: Payer: Self-pay | Admitting: Nurse Practitioner

## 2020-10-04 ENCOUNTER — Other Ambulatory Visit (HOSPITAL_COMMUNITY)
Admission: RE | Admit: 2020-10-04 | Discharge: 2020-10-04 | Disposition: A | Discharge status: Home / Self Care | Payer: 59 | Source: Ambulatory Visit | Attending: Nurse Practitioner | Admitting: Nurse Practitioner

## 2020-10-04 VITALS — BP 124/80 | HR 70 | Temp 97.9°F | Ht 66.2 in | Wt 243.0 lb

## 2020-10-04 DIAGNOSIS — N898 Other specified noninflammatory disorders of vagina: Secondary | ICD-10-CM

## 2020-10-04 DIAGNOSIS — Z124 Encounter for screening for malignant neoplasm of cervix: Secondary | ICD-10-CM | POA: Diagnosis present

## 2020-10-04 MED ORDER — TINIDAZOLE 500 MG PO TABS: ORAL_TABLET | ORAL | 0 refills | Status: DC

## 2020-10-04 MED ORDER — FLUCONAZOLE 100 MG PO TABS: 100.0000 mg | ORAL_TABLET | Freq: Every day | ORAL | 0 refills | Status: DC

## 2020-10-04 NOTE — Progress Notes (Signed)
I,Yamilka Roman [employer] as a Education administrator for [employer], FNP.,have documented all relevant documentation on the behalf of [name], FNP,as directed by  [name], FNP while in the presence of [name], [place]. This visit occurred during the SARS-CoV-2 public health emergency.  Safety protocols were in place, including screening questions prior to the visit, additional usage of staff PPE, and extensive cleaning of exam room while observing appropriate contact time as indicated for disinfecting solutions.  Subjective:     Patient ID: Lauren Carr , female    DOB: 1976-05-16 , 44 y.o.   MRN: [id number]   Chief Complaint  Patient presents with  . Gynecologic Exam    patient presents today for a pap smear.    HPI  Patient presents today for a pap smear.  She has been trying holistic treatment for the multiple bacterial infections.   Gynecologic Exam The patient's primary symptoms include vaginal discharge. The current episode started 1 to 4 weeks ago. She is not pregnant. Pertinent negatives include no abdominal pain. The vaginal discharge was white (she was treated for a bacterial infection in the last 3-4 months). She has not been passing clots. She has not been passing tissue.     Past Medical History:  Diagnosis Date  . Hypertension   . NSTEMI (non-ST elevated myocardial infarction) ([place])   . Obstructive sleep apnea      Family History  Problem Relation Age of Onset  . Breast cancer Mother   . Hypertension Mother   . Long QT syndrome Mother        Diagnosed in 58s after syncope. Has ICD placed  . Cancer - Prostate Father   . Hypertension Father   . Cancer - Cervical Maternal Grandmother   . Diabetes Maternal Grandmother   . CAD Maternal Grandfather   . CAD Paternal Grandfather      Current Outpatient Medications:  .  amLODipine (NORVASC) 5 MG tablet, TAKE 1 TABLET BY MOUTH EVERY DAY, Disp: 30 tablet, Rfl: 11 .  aspirin 81 MG EC tablet, TAKE 1 TABLET BY  MOUTH EVERY DAY, Disp: 30 tablet, Rfl: 5 .  atorvastatin (LIPITOR) 80 MG tablet, Take 1 tablet (80 mg total) by mouth daily at 6 PM., Disp: 90 tablet, Rfl: 1 .  cholecalciferol (VITAMIN D3) 25 MCG (1000 UT) tablet, Take 3,000 Units by mouth daily., Disp: , Rfl:  .  hydrochlorothiazide (HYDRODIURIL) 12.5 MG tablet, TAKE 1 TABLET BY MOUTH EVERY DAY, Disp: 30 tablet, Rfl: 2 .  metoprolol tartrate (LOPRESSOR) 25 MG tablet, Take 0.5 tablets (12.5 mg total) by mouth 2 (two) times daily., Disp: 180 tablet, Rfl: 2 .  nitroGLYCERIN (NITROSTAT) 0.4 MG SL tablet, Place 1 tablet (0.4 mg total) under the tongue every 5 (five) minutes x 3 doses as needed for chest pain., Disp: 30 tablet, Rfl: 2 .  fluconazole (DIFLUCAN) 100 MG tablet, Take 1 tablet (100 mg total) by mouth daily. Take 1 tablet by mouth now repeat in 5 days, Disp: 2 tablet, Rfl: 0 .  tinidazole (TINDAMAX) 500 MG tablet, Take two tablets by mouth once daily x 5 days, Disp: 10 tablet, Rfl: 0   No Known Allergies   Review of Systems  Constitutional: Negative.   Gastrointestinal: Negative for abdominal pain.  Genitourinary: Positive for vaginal discharge.     Today's Vitals   10/04/20 1151  BP: 124/80  Pulse: 70  Temp: 97.9 F (36.6 C)  TempSrc: Oral  Weight: 243 lb (110.2 kg)  Height:  5' 6.2" (1.681 m)  PainSc: 0-No pain   Body mass index is 38.98 kg/m.   Objective:  Physical Exam Constitutional:      Appearance: Normal appearance.  Pulmonary:     Effort: Pulmonary effort is normal. No respiratory distress.  Genitourinary:    General: Normal vulva.     Labia:        Right: No tenderness.        Left: No tenderness.      Vagina: Normal.     Uterus: Normal.      Adnexa: Right adnexa normal and left adnexa normal.       Right: No tenderness.         Left: No tenderness.       Comments: Cervix is pointing posteriorly, thick pale yellow, white discharge present to vaginal wall and cervix Neurological:     Mental Status: She  is alert.  Psychiatric:        Mood and Affect: Mood normal.        Behavior: Behavior normal.        Thought Content: Thought content normal.        Judgment: Judgment normal.         Assessment And Plan:     1. Vaginal discharge  Slight pale yellow and white discharge present to vaginal wall and cervix area  Will treat for bacterial vaginosis and yeast, also sent vaginal swab - tinidazole (TINDAMAX) 500 MG tablet; Take two tablets by mouth once daily x 5 days  Dispense: 10 tablet; Refill: 0 - fluconazole (DIFLUCAN) 100 MG tablet; Take 1 tablet (100 mg total) by mouth daily. Take 1 tablet by mouth now repeat in 5 days  Dispense: 2 tablet; Refill: 0 - Cervicovaginal ancillary only  2. Encounter for Papanicolaou smear of cervix  PAP done with cervix pointing posteriorly  She is to follow up with Dr. Garwin Brothers in the future for her PAPs - Cytology -Pap Smear     Patient was given opportunity to ask questions. Patient verbalized understanding of the plan and was able to repeat key elements of the plan. All questions were answered to their satisfaction.   Teola Bradley, FNP, have reviewed all documentation for this visit. The documentation on 10/04/20 for the exam, diagnosis, procedures, and orders are all accurate and complete.  THE PATIENT IS ENCOURAGED TO PRACTICE SOCIAL DISTANCING DUE TO THE COVID-19 PANDEMIC.

## 2020-10-04 NOTE — Patient Instructions (Signed)

## 2020-10-05 ENCOUNTER — Ambulatory Visit
Admission: RE | Admit: 2020-10-05 | Discharge: 2020-10-05 | Disposition: A | Discharge status: Home / Self Care | Payer: 59 | Source: Ambulatory Visit | Attending: Nurse Practitioner | Admitting: Nurse Practitioner

## 2020-10-05 DIAGNOSIS — Z1231 Encounter for screening mammogram for malignant neoplasm of breast: Secondary | ICD-10-CM

## 2020-10-06 LAB — CERVICOVAGINAL ANCILLARY ONLY
Bacterial Vaginitis (gardnerella): POSITIVE — AB
Candida Glabrata: NEGATIVE
Candida Vaginitis: POSITIVE — AB
Comment: NEGATIVE
Comment: NEGATIVE
Comment: NEGATIVE

## 2020-10-10 LAB — CYTOLOGY - PAP
Comment: NEGATIVE
Diagnosis: UNDETERMINED — AB
High risk HPV: NEGATIVE

## 2020-10-18 ENCOUNTER — Ambulatory Visit: Payer: 59

## 2020-10-24 ENCOUNTER — Other Ambulatory Visit: Payer: Self-pay | Admitting: Cardiology

## 2020-10-24 DIAGNOSIS — I10 Essential (primary) hypertension: Secondary | ICD-10-CM

## 2020-10-30 ENCOUNTER — Other Ambulatory Visit: Payer: Self-pay | Admitting: Nurse Practitioner

## 2020-12-17 ENCOUNTER — Other Ambulatory Visit: Payer: Self-pay | Admitting: Cardiology

## 2020-12-17 DIAGNOSIS — I214 Non-ST elevation (NSTEMI) myocardial infarction: Secondary | ICD-10-CM

## 2020-12-22 ENCOUNTER — Other Ambulatory Visit: Payer: Self-pay | Admitting: Cardiology

## 2020-12-22 DIAGNOSIS — I214 Non-ST elevation (NSTEMI) myocardial infarction: Secondary | ICD-10-CM

## 2021-02-06 ENCOUNTER — Other Ambulatory Visit: Payer: Self-pay | Admitting: Nurse Practitioner

## 2021-02-18 ENCOUNTER — Other Ambulatory Visit: Payer: Self-pay | Admitting: Cardiology

## 2021-02-18 DIAGNOSIS — E782 Mixed hyperlipidemia: Secondary | ICD-10-CM

## 2021-02-18 DIAGNOSIS — I252 Old myocardial infarction: Secondary | ICD-10-CM

## 2021-03-14 ENCOUNTER — Ambulatory Visit: Payer: 59 | Admitting: Nurse Practitioner

## 2021-03-29 ENCOUNTER — Encounter: Payer: Self-pay | Admitting: Nurse Practitioner

## 2021-03-29 ENCOUNTER — Ambulatory Visit: Payer: 59 | Admitting: Nurse Practitioner

## 2021-03-29 ENCOUNTER — Other Ambulatory Visit: Payer: Self-pay | Admitting: Nurse Practitioner

## 2021-03-29 ENCOUNTER — Other Ambulatory Visit: Payer: Self-pay

## 2021-03-29 VITALS — BP 118/74 | HR 79 | Temp 97.9°F | Ht 66.2 in | Wt 253.0 lb

## 2021-03-29 DIAGNOSIS — Z6841 Body Mass Index (BMI) 40.0 and over, adult: Secondary | ICD-10-CM | POA: Diagnosis not present

## 2021-03-29 DIAGNOSIS — I10 Essential (primary) hypertension: Secondary | ICD-10-CM | POA: Diagnosis not present

## 2021-03-29 DIAGNOSIS — K219 Gastro-esophageal reflux disease without esophagitis: Secondary | ICD-10-CM

## 2021-03-29 DIAGNOSIS — R7309 Other abnormal glucose: Secondary | ICD-10-CM

## 2021-03-29 MED ORDER — DEXLANSOPRAZOLE 30 MG PO CPDR: 30.0000 mg | DELAYED_RELEASE_CAPSULE | Freq: Every day | ORAL | 2 refills | Status: DC

## 2021-03-29 NOTE — Progress Notes (Signed)
I,Yamilka [employer] as a Education administrator for [employer], FNP.,have documented all relevant documentation on the behalf of [name], FNP,as directed by  [name], FNP while in the presence of [name], [place]. This visit occurred during the SARS-CoV-2 public health emergency.  Safety protocols were in place, including screening questions prior to the visit, additional usage of staff PPE, and extensive cleaning of exam room while observing appropriate contact time as indicated for disinfecting solutions.  Subjective:     Patient ID: Lauren Carr , female    DOB: [birth year] , 45 y.o.   MRN: [id number]   Chief Complaint  Patient presents with  . Hypertension  . abnormal glucose  . Gastroesophageal Reflux    HPI  Patient presents today for a f/u on her hypertension, abnormal glucose and thyroid.  GERD is getting worse - she has been cutting back on tomatoes, orange juice and fried foods, she does not eat spicy foods.  Two weeks ago she was awakened in her sleep unable to breath and vomited. She has been exercising but not consistent.  She has taken prilosec and nexium (recently not working)  [employer] from Last 3 Encounters: 03/29/21 : 253 lb (114.8 kg) 10/04/20 : 243 lb (110.2 kg) 09/13/20 : 245 lb 3.2 oz (111.2 kg)  Hypertension This is a chronic problem. The current episode started more than 1 year ago. The problem is controlled. Pertinent negatives include no anxiety. Risk factors for coronary artery disease include obesity and sedentary lifestyle. There are no compliance problems.  There is no history of angina. There is no history of chronic renal disease.  Gastroesophageal Reflux She complains of heartburn. She reports no abdominal pain, no choking or no coughing. Pertinent negatives include no fatigue. She has tried a diet change for the symptoms.     Past Medical History:  Diagnosis Date  . Hypertension   . NSTEMI (non-ST elevated myocardial infarction) ([place])    . Obstructive sleep apnea      Family History  Problem Relation Age of Onset  . Breast cancer Mother        early 50s  . Hypertension Mother   . Long QT syndrome Mother        Diagnosed in 30s after syncope. Has ICD placed  . Cancer - Prostate Father   . Hypertension Father   . Cancer - Cervical Maternal Grandmother   . Diabetes Maternal Grandmother   . CAD Maternal Grandfather   . CAD Paternal Grandfather      Current Outpatient Medications:  .  amLODipine (NORVASC) 5 MG tablet, TAKE 1 TABLET BY MOUTH EVERY DAY, Disp: 30 tablet, Rfl: 11 .  aspirin 81 MG EC tablet, TAKE 1 TABLET BY MOUTH EVERY DAY, Disp: 90 tablet, Rfl: 1 .  atorvastatin (LIPITOR) 80 MG tablet, TAKE 1 TABLET (80 MG TOTAL) BY MOUTH DAILY AT 6 PM., Disp: 90 tablet, Rfl: 1 .  cholecalciferol (VITAMIN D3) 25 MCG (1000 UT) tablet, Take 3,000 Units by mouth daily., Disp: , Rfl:  .  hydrochlorothiazide (HYDRODIURIL) 12.5 MG tablet, TAKE 1 TABLET BY MOUTH EVERY DAY, Disp: 90 tablet, Rfl: 1 .  metoprolol tartrate (LOPRESSOR) 25 MG tablet, TAKE HALF A PILL BY MOUTH 2 TIMES DAILY., Disp: 30 tablet, Rfl: 17 .  nitroGLYCERIN (NITROSTAT) 0.4 MG SL tablet, PLACE 1 TABLET UNDER THE TONGUE EVERY 5 MINUTES X 3 DOSES AS NEEDED FOR CHEST PAIN., Disp: 25 tablet, Rfl: 2 .  Dexlansoprazole 30 MG capsule, TAKE 1 CAPSULE  BY MOUTH EVERY DAY, Disp: 30 capsule, Rfl: 2   No Known Allergies   Review of Systems  Constitutional: Negative.  Negative for fatigue.  Respiratory: Negative for cough and choking.   Gastrointestinal: Positive for heartburn. Negative for abdominal pain.  Genitourinary: Negative.   Musculoskeletal: Negative.   Skin: Negative.   Psychiatric/Behavioral: Negative.      Today's Vitals   03/29/21 1553  BP: 118/74  Pulse: 79  Temp: 97.9 F (36.6 C)  TempSrc: Oral  Weight: 253 lb (114.8 kg)  Height: 5' 6.2" (1.681 m)  PainSc: 0-No pain   Body mass index is 40.59 kg/m.   Objective:  Physical  Exam Constitutional:      General: She is not in acute distress.    Appearance: Normal appearance. She is obese.  Cardiovascular:     Rate and Rhythm: Normal rate and regular rhythm.     Pulses: Normal pulses.     Heart sounds: Normal heart sounds. No murmur heard.   Pulmonary:     Effort: Pulmonary effort is normal. No respiratory distress.     Breath sounds: Normal breath sounds. No wheezing.  Skin:    Capillary Refill: Capillary refill takes less than 2 seconds.  Neurological:     General: No focal deficit present.     Mental Status: She is alert and oriented to person, place, and time.     Cranial Nerves: No cranial nerve deficit.  Psychiatric:        Mood and Affect: Mood normal.        Behavior: Behavior normal.        Thought Content: Thought content normal.        Judgment: Judgment normal.         Assessment And Plan:     1. Essential hypertension  Blood pressure is well controlled  Continue with current medications - CMP14+EGFR  2. Abnormal glucose  Chronic, stable  Encouraged to limit intake of sugary foods and drinks  3. Gastroesophageal reflux disease without esophagitis  She is encouraged to take a probiotic daily  Will start her on dexilant pending insurance approval  Return to office in 6 weeks for follow up  4. BMI 40.0-44.9, adult (HCC)  Chronic  Discussed healthy diet and regular exercise options   Encouraged to exercise at least 150 minutes per week with 2 days of strength training [employer] from Last 3 Encounters:  03/29/21 253 lb (114.8 kg)  10/04/20 243 lb (110.2 kg)  09/13/20 245 lb 3.2 oz (111.2 kg)   she has gained approximately 10 lbs since October  Will check random insulin and thyroid level to determine metabolic cause for weight gain - Insulin, random - TSH     Patient was given opportunity to ask questions. Patient verbalized understanding of the plan and was able to repeat key elements of the plan. All questions  were answered to their satisfaction.  [name], FNP   I, [name], FNP, have reviewed all documentation for this visit. The documentation on 03/29/21 for the exam, diagnosis, procedures, and orders are all accurate and complete.   IF YOU HAVE BEEN REFERRED TO A SPECIALIST, IT MAY TAKE 1-2 WEEKS TO SCHEDULE/PROCESS THE REFERRAL. IF YOU HAVE NOT HEARD FROM US/SPECIALIST IN TWO WEEKS, PLEASE GIVE [place] A CALL AT [phone].   THE PATIENT IS ENCOURAGED TO PRACTICE SOCIAL DISTANCING DUE TO THE COVID-19 PANDEMIC.

## 2021-03-29 NOTE — Patient Instructions (Signed)

## 2021-03-30 LAB — CMP14+EGFR
ALT: 71 IU/L — ABNORMAL HIGH (ref 0–32)
AST: 64 IU/L — ABNORMAL HIGH (ref 0–40)
Albumin/Globulin Ratio: 1.3 (ref 1.2–2.2)
Albumin: 4.4 g/dL (ref 3.8–4.8)
Alkaline Phosphatase: 69 IU/L (ref 44–121)
BUN/Creatinine Ratio: 13 (ref 9–23)
BUN: 13 mg/dL (ref 6–24)
Bilirubin Total: 0.5 mg/dL (ref 0.0–1.2)
CO2: 21 mmol/L (ref 20–29)
Calcium: 10 mg/dL (ref 8.7–10.2)
Chloride: 97 mmol/L (ref 96–106)
Creatinine, Ser: 1 mg/dL (ref 0.57–1.00)
Globulin, Total: 3.5 g/dL (ref 1.5–4.5)
Glucose: 102 mg/dL — ABNORMAL HIGH (ref 65–99)
Potassium: 4.1 mmol/L (ref 3.5–5.2)
Sodium: 138 mmol/L (ref 134–144)
Total Protein: 7.9 g/dL (ref 6.0–8.5)
eGFR: 70 mL/min/{1.73_m2} (ref >59)

## 2021-03-30 LAB — INSULIN, RANDOM: INSULIN: 45 u[IU]/mL — ABNORMAL HIGH (ref 2.6–24.9)

## 2021-03-30 LAB — TSH: TSH: 0.913 u[IU]/mL (ref 0.450–4.500)

## 2021-04-04 ENCOUNTER — Other Ambulatory Visit: Payer: Self-pay | Admitting: Nurse Practitioner

## 2021-04-28 ENCOUNTER — Other Ambulatory Visit: Payer: Self-pay | Admitting: Nurse Practitioner

## 2021-04-28 MED ORDER — METFORMIN HCL 500 MG PO TABS: 500.0000 mg | ORAL_TABLET | Freq: Two times a day (BID) | ORAL | 2 refills | Status: DC

## 2021-06-15 ENCOUNTER — Ambulatory Visit: Payer: 59 | Admitting: Cardiology

## 2021-07-03 ENCOUNTER — Other Ambulatory Visit: Payer: Self-pay

## 2021-07-03 ENCOUNTER — Encounter: Payer: Self-pay | Admitting: Nurse Practitioner

## 2021-07-03 ENCOUNTER — Ambulatory Visit: Payer: 59 | Admitting: Nurse Practitioner

## 2021-07-03 VITALS — BP 130/84 | HR 71 | Temp 97.8°F | Ht 66.2 in | Wt 245.2 lb

## 2021-07-03 DIAGNOSIS — G4733 Obstructive sleep apnea (adult) (pediatric): Secondary | ICD-10-CM

## 2021-07-03 DIAGNOSIS — E88819 Insulin resistance, unspecified: Secondary | ICD-10-CM

## 2021-07-03 DIAGNOSIS — Z6839 Body mass index (BMI) 39.0-39.9, adult: Secondary | ICD-10-CM

## 2021-07-03 DIAGNOSIS — E8881 Metabolic syndrome: Secondary | ICD-10-CM

## 2021-07-03 DIAGNOSIS — E6609 Other obesity due to excess calories: Secondary | ICD-10-CM

## 2021-07-03 DIAGNOSIS — E7800 Pure hypercholesterolemia, unspecified: Secondary | ICD-10-CM

## 2021-07-03 DIAGNOSIS — R5383 Other fatigue: Secondary | ICD-10-CM | POA: Diagnosis not present

## 2021-07-03 DIAGNOSIS — I10 Essential (primary) hypertension: Secondary | ICD-10-CM | POA: Diagnosis not present

## 2021-07-03 DIAGNOSIS — Z1211 Encounter for screening for malignant neoplasm of colon: Secondary | ICD-10-CM

## 2021-07-03 DIAGNOSIS — R7309 Other abnormal glucose: Secondary | ICD-10-CM | POA: Diagnosis not present

## 2021-07-03 DIAGNOSIS — E66812 Obesity, class 2: Secondary | ICD-10-CM

## 2021-07-03 NOTE — Progress Notes (Signed)
I,Yamilka [employer] as a Education administrator for [employer], FNP.,have documented all relevant documentation on the behalf of [name], FNP,as directed by  [name], FNP while in the presence of [name], [place].  This visit occurred during the SARS-CoV-2 public health emergency.  Safety protocols were in place, including screening questions prior to the visit, additional usage of staff PPE, and extensive cleaning of exam room while observing appropriate contact time as indicated for disinfecting solutions.  Subjective:     Patient ID: Lauren Carr , female    DOB: [birth year] , 45 y.o.   MRN: [id number]   Chief Complaint  Patient presents with   Hypertension    HPI  Patient presents today for a f/u on her hypertension.  She has been exercising 3-4 days a week and working on her diet.  [employer] from Last 3 Encounters: 07/03/21 : 245 lb 3.2 oz (111.2 kg) 03/29/21 : 253 lb (114.8 kg) 10/04/20 : 243 lb (110.2 kg)    Hypertension This is a chronic problem. The current episode started more than 1 year ago. The problem is controlled. Pertinent negatives include no anxiety. There are no associated agents to hypertension. Risk factors for coronary artery disease include obesity and sedentary lifestyle. Past treatments include calcium channel blockers and beta blockers. There are no compliance problems.  There is no history of angina. There is no history of chronic renal disease.  Gastroesophageal Reflux She complains of heartburn. She reports no abdominal pain, no choking or no coughing. Associated symptoms include fatigue. Risk factors include obesity. She has tried a diet change for the symptoms.    Past Medical History:  Diagnosis Date   Hypertension    NSTEMI (non-ST elevated myocardial infarction) ([place])    Obstructive sleep apnea      Family History  Problem Relation Age of Onset   Breast cancer Mother        early 45s   Hypertension Mother    Long QT syndrome  Mother        Diagnosed in 63s after syncope. Has ICD placed   Cancer - Prostate Father    Hypertension Father    Cancer - Cervical Maternal Grandmother    Diabetes Maternal Grandmother    CAD Maternal Grandfather    CAD Paternal Grandfather      Current Outpatient Medications:    amLODipine (NORVASC) 5 MG tablet, TAKE 1 TABLET BY MOUTH EVERY DAY, Disp: 30 tablet, Rfl: 11   aspirin 81 MG EC tablet, TAKE 1 TABLET BY MOUTH EVERY DAY, Disp: 90 tablet, Rfl: 1   atorvastatin (LIPITOR) 80 MG tablet, TAKE 1 TABLET (80 MG TOTAL) BY MOUTH DAILY AT 6 PM., Disp: 90 tablet, Rfl: 1   cholecalciferol (VITAMIN D3) 25 MCG (1000 UT) tablet, Take 3,000 Units by mouth daily., Disp: , Rfl:    Dexlansoprazole 30 MG capsule, TAKE 1 CAPSULE BY MOUTH EVERY DAY, Disp: 30 capsule, Rfl: 2   hydrochlorothiazide (HYDRODIURIL) 12.5 MG tablet, TAKE 1 TABLET BY MOUTH EVERY DAY, Disp: 90 tablet, Rfl: 1   metFORMIN (GLUCOPHAGE) 500 MG tablet, Take 1 tablet (500 mg total) by mouth 2 (two) times daily with a meal., Disp: 60 tablet, Rfl: 2   metoprolol tartrate (LOPRESSOR) 25 MG tablet, TAKE HALF A PILL BY MOUTH 2 TIMES DAILY., Disp: 30 tablet, Rfl: 17   nitroGLYCERIN (NITROSTAT) 0.4 MG SL tablet, PLACE 1 TABLET UNDER THE TONGUE EVERY 5 MINUTES X 3 DOSES AS NEEDED FOR CHEST PAIN., Disp: 25  tablet, Rfl: 2   No Known Allergies   Review of Systems  Constitutional:  Positive for fatigue.  Respiratory:  Negative for cough and choking.   Cardiovascular: Negative.   Gastrointestinal:  Positive for heartburn. Negative for abdominal pain.  Genitourinary: Negative.   Musculoskeletal: Negative.   Skin: Negative.   Neurological: Negative.   Psychiatric/Behavioral: Negative.      Today's Vitals   07/03/21 1608  BP: 130/84  Pulse: 71  Temp: 97.8 F (36.6 C)  TempSrc: Oral  Weight: 245 lb 3.2 oz (111.2 kg)  Height: 5' 6.2" (1.681 m)  PainSc: 0-No pain   Body mass index is 39.34 kg/m.   Objective:  Physical  Exam Constitutional:      General: She is not in acute distress.    Appearance: Normal appearance. She is obese.  Cardiovascular:     Rate and Rhythm: Normal rate and regular rhythm.     Pulses: Normal pulses.     Heart sounds: Normal heart sounds. No murmur heard. Pulmonary:     Effort: Pulmonary effort is normal. No respiratory distress.     Breath sounds: Normal breath sounds. No wheezing.  Skin:    Capillary Refill: Capillary refill takes less than 2 seconds.  Neurological:     General: No focal deficit present.     Mental Status: She is alert and oriented to person, place, and time.     Cranial Nerves: No cranial nerve deficit.  Psychiatric:        Mood and Affect: Mood normal.        Behavior: Behavior normal.        Thought Content: Thought content normal.        Judgment: Judgment normal.        Assessment And Plan:     1. Essential hypertension Chronic, blood pressure is under fair control No changes to medications - CMP14+EGFR  2. Abnormal glucose Chronic, stable Tolerating metformin well - Hemoglobin A1c - Insulin, random  3. OSA (obstructive sleep apnea) She has a previous history of sleep apnea but is not using a CPAP machine Will refer back to neurology for further evaluation this could be causing her fatigue - Ambulatory referral to Sleep Studies  4. Fatigue, unspecified type - Iron, TIBC and Ferritin Panel - Vitamin B12 - VITAMIN D 25 Hydroxy (Vit-D Deficiency, Fractures)  5. Elevated cholesterol Continue with statin, tolerating well - Lipid panel  6. Insulin resistance - Insulin, random  7. Encounter for screening colonoscopy According to USPTF Colorectal cancer Screening guidelines. Colonoscopy is recommended every 10 years, starting at age 76 years. Will refer to GI for colon cancer screening. - Ambulatory referral to Gastroenterology  8. Class 2 obesity due to excess calories with body mass index (BMI) of 39.0 to 39.9 in adult,  unspecified whether serious comorbidity present  Chronic Discussed healthy diet and regular exercise options  Encouraged to exercise at least 150 minutes per week with 2 days of strength training She has lost 8 lbs since her last office visit.   Patient was given opportunity to ask questions. Patient verbalized understanding of the plan and was able to repeat key elements of the plan. All questions were answered to their satisfaction.  [name], FNP   I, [name], FNP, have reviewed all documentation for this visit. The documentation on 07/03/21 for the exam, diagnosis, procedures, and orders are all accurate and complete.   IF YOU HAVE BEEN REFERRED TO A SPECIALIST, IT MAY TAKE 1-2  WEEKS TO SCHEDULE/PROCESS THE REFERRAL. IF YOU HAVE NOT HEARD FROM US/SPECIALIST IN TWO WEEKS, PLEASE GIVE [place] A CALL AT [phone].   THE PATIENT IS ENCOURAGED TO PRACTICE SOCIAL DISTANCING DUE TO THE COVID-19 PANDEMIC.

## 2021-07-03 NOTE — Patient Instructions (Signed)

## 2021-07-04 LAB — CMP14+EGFR
ALT: 29 IU/L (ref 0–32)
AST: 23 IU/L (ref 0–40)
Albumin/Globulin Ratio: 1.5 (ref 1.2–2.2)
Albumin: 5.1 g/dL — ABNORMAL HIGH (ref 3.8–4.8)
Alkaline Phosphatase: 80 IU/L (ref 44–121)
BUN/Creatinine Ratio: 14 (ref 9–23)
BUN: 12 mg/dL (ref 6–24)
Bilirubin Total: 0.6 mg/dL (ref 0.0–1.2)
CO2: 22 mmol/L (ref 20–29)
Calcium: 10.1 mg/dL (ref 8.7–10.2)
Chloride: 98 mmol/L (ref 96–106)
Creatinine, Ser: 0.85 mg/dL (ref 0.57–1.00)
Globulin, Total: 3.4 g/dL (ref 1.5–4.5)
Glucose: 86 mg/dL (ref 65–99)
Potassium: 3.8 mmol/L (ref 3.5–5.2)
Sodium: 138 mmol/L (ref 134–144)
Total Protein: 8.5 g/dL (ref 6.0–8.5)
eGFR: 85 mL/min/{1.73_m2} (ref >59)

## 2021-07-04 LAB — IRON,TIBC AND FERRITIN PANEL
Ferritin: 159 ng/mL — ABNORMAL HIGH (ref 15–150)
Iron Saturation: 16 % (ref 15–55)
Iron: 65 ug/dL (ref 27–159)
Total Iron Binding Capacity: 396 ug/dL (ref 250–450)
UIBC: 331 ug/dL (ref 131–425)

## 2021-07-04 LAB — HEMOGLOBIN A1C
Est. average glucose Bld gHb Est-mCnc: 117 mg/dL
Hgb A1c MFr Bld: 5.7 % — ABNORMAL HIGH (ref 4.8–5.6)

## 2021-07-04 LAB — LIPID PANEL
Chol/HDL Ratio: 2.4 ratio (ref 0.0–4.4)
Cholesterol, Total: 139 mg/dL (ref 100–199)
HDL: 58 mg/dL (ref >39)
LDL Chol Calc (NIH): 58 mg/dL (ref 0–99)
Triglycerides: 134 mg/dL (ref 0–149)
VLDL Cholesterol Cal: 23 mg/dL (ref 5–40)

## 2021-07-04 LAB — VITAMIN B12: Vitamin B-12: 531 pg/mL (ref 232–1245)

## 2021-07-04 LAB — INSULIN, RANDOM: INSULIN: 20.1 u[IU]/mL (ref 2.6–24.9)

## 2021-07-04 LAB — VITAMIN D 25 HYDROXY (VIT D DEFICIENCY, FRACTURES): Vit D, 25-Hydroxy: 73.9 ng/mL (ref 30.0–100.0)

## 2021-07-10 ENCOUNTER — Other Ambulatory Visit: Payer: Self-pay

## 2021-07-10 ENCOUNTER — Encounter: Payer: Self-pay | Admitting: Cardiology

## 2021-07-10 ENCOUNTER — Ambulatory Visit: Payer: 59 | Admitting: Cardiology

## 2021-07-10 VITALS — BP 128/71 | HR 82 | Temp 97.8°F | Ht 66.2 in | Wt 245.0 lb

## 2021-07-10 DIAGNOSIS — I252 Old myocardial infarction: Secondary | ICD-10-CM

## 2021-07-10 DIAGNOSIS — E782 Mixed hyperlipidemia: Secondary | ICD-10-CM

## 2021-07-10 DIAGNOSIS — R002 Palpitations: Secondary | ICD-10-CM

## 2021-07-10 DIAGNOSIS — I10 Essential (primary) hypertension: Secondary | ICD-10-CM

## 2021-07-10 NOTE — Progress Notes (Signed)
Subjective:   Lauren Carr, female    DOB: 04-11-1976, 45 y.o.   MRN: [id number]   Chief complaint:  Chest pain  45 y/o [place] American female with hypertension, hyperlipidemia, family h/o long QT syndrome, NSTEMI with no obstructive CAD on cath (03/2019)  Patient has not had any chest pain.  3 months ago, she had an episode of sudden onset palpitation lasting for 15 minutes or so.  Her Apple Watch noted her heart rate to be elevated around 200 bpm.  EMS were called.  However, by the time EMS arrived, her symptoms were improving.  Therefore, she decided not to go to the hospital.  She has not had any severe episodes since then, but continues to have occasional palpitations lasting for a minute or 2.  She denies any chest pain, shortness of breath or any other symptoms.  She is trying to walk regularly.  Reviewed recent lab results with the patient, details below.   Current Outpatient Medications on File Prior to Visit  Medication Sig Dispense Refill   amLODipine (NORVASC) 5 MG tablet TAKE 1 TABLET BY MOUTH EVERY DAY 30 tablet 11   aspirin 81 MG EC tablet TAKE 1 TABLET BY MOUTH EVERY DAY 90 tablet 1   atorvastatin (LIPITOR) 80 MG tablet TAKE 1 TABLET (80 MG TOTAL) BY MOUTH DAILY AT 6 PM. 90 tablet 1   cholecalciferol (VITAMIN D3) 25 MCG (1000 UT) tablet Take 3,000 Units by mouth daily.     Dexlansoprazole 30 MG capsule TAKE 1 CAPSULE BY MOUTH EVERY DAY 30 capsule 2   metFORMIN (GLUCOPHAGE) 500 MG tablet Take 1 tablet (500 mg total) by mouth 2 (two) times daily with a meal. 60 tablet 2   metoprolol tartrate (LOPRESSOR) 25 MG tablet TAKE HALF A PILL BY MOUTH 2 TIMES DAILY. 30 tablet 17   nitroGLYCERIN (NITROSTAT) 0.4 MG SL tablet PLACE 1 TABLET UNDER THE TONGUE EVERY 5 MINUTES X 3 DOSES AS NEEDED FOR CHEST PAIN. 25 tablet 2   hydrochlorothiazide (HYDRODIURIL) 12.5 MG tablet TAKE 1 TABLET BY MOUTH EVERY DAY 90 tablet 1   valACYclovir (VALTREX) 1000 MG tablet Take 500 mg by mouth  daily.     No current facility-administered medications on file prior to visit.    Cardiovascular studies:  EKG 07/10/2021: Sinus rhythm 78 bpm  Leftward axis Old anteroseptal infarct Nonspecific T-abnormality  Coronary angiogram 04/20/2019: LM: Normal LAD: Medium caliber. Mid LAD myocardial bridge. Small diagonal branches. No significant stenoses. LCx: Small caliber. Normal RCA: Dominant. No stenoses LVEDP 22 mmHg   Impression: No angiographically evident coronary artery disease   Recommendation: One year DAPT for medical treatment of NSTEMI   Echocardiogram 04/19/2019:  1. Mild hypokinesis of the left ventricular, basal-mid inferolateral wall.  2. The left ventricle has normal systolic function, with an ejection fraction of 60-65%. Left ventricular diastolic function could not be evaluated.  3. The right ventricle has normal systolc function. The cavity was normal.  4. The aortic valve is tricuspid Aortic valve regurgitation is mild by color flow Doppler.  5. The aortic root and ascending aorta are normal in size and structure.    Recent labs: 07/29/2019: Glucose 95, BUN/Cr 12/0.98. EGFR 81. Na/K 136/4.0.  H/H 11/35. MCV 83. Platelets 256 HbA1C 5.7% Chol 128, TG 98, HDL 54, LDL 54 TSH 1.8 normal      Review of Systems  Cardiovascular:  Positive for palpitations. Negative for chest pain, dyspnea on exertion, leg swelling and syncope.  Vitals:   07/10/21 1316  BP: 128/71  Pulse: 82  Temp: 97.8 F (36.6 C)  SpO2: 96%     Physical Exam Vitals and nursing note reviewed.  Constitutional:      General: She is not in acute distress. Neck:     Vascular: No JVD.  Cardiovascular:     Rate and Rhythm: Normal rate and regular rhythm.     Pulses: Intact distal pulses.     Heart sounds: Normal heart sounds. No murmur heard. Pulmonary:     Effort: Pulmonary effort is normal.     Breath sounds: Normal breath sounds. No wheezing or rales.  Musculoskeletal:      Right lower leg: No edema.     Left lower leg: No edema.             Assessment & Recommendations:   45 y/o [place] American female with hypertension, hyperlipidemia, family h/o long QT syndrome, NSTEMI with no obstructive CAD on cath (03/2019)  Palpitations: Suspect SVT or any such tachyarrhythmia.  Discussed vagal maneuvers.  Continue current medical therapy.  We will monitor her rhythm on apple watch through cardio logs.  H/o NSTEMI 03/2020. No obstructive CAD on coronary angiogram. Continue Aspirin 81 mg. Continue lipitor 80 mg daily, metoprolol 12.5 mg bid, amlodipine 5 mg daily.   Hypertension: Controlled.  Hyperlipidemia: Continue lipitor 80 mg.  Lipids well controlled.  F/u in 3 months   [place], MD [facility] Pager: [phone] Office: [phone]

## 2021-07-12 ENCOUNTER — Encounter: Payer: Self-pay | Admitting: Nurse Practitioner

## 2021-07-15 ENCOUNTER — Other Ambulatory Visit: Payer: Self-pay | Admitting: Nurse Practitioner

## 2021-07-15 ENCOUNTER — Other Ambulatory Visit: Payer: Self-pay | Admitting: Cardiology

## 2021-07-15 DIAGNOSIS — K219 Gastro-esophageal reflux disease without esophagitis: Secondary | ICD-10-CM

## 2021-07-15 DIAGNOSIS — I214 Non-ST elevation (NSTEMI) myocardial infarction: Secondary | ICD-10-CM

## 2021-07-26 ENCOUNTER — Other Ambulatory Visit: Payer: Self-pay | Admitting: Nurse Practitioner

## 2021-08-01 ENCOUNTER — Other Ambulatory Visit: Payer: Self-pay | Admitting: Nurse Practitioner

## 2021-08-15 ENCOUNTER — Other Ambulatory Visit: Payer: Self-pay | Admitting: Nurse Practitioner

## 2021-09-20 ENCOUNTER — Encounter: Payer: 59 | Admitting: Nurse Practitioner

## 2021-09-29 ENCOUNTER — Other Ambulatory Visit: Payer: Self-pay | Admitting: Cardiology

## 2021-09-29 DIAGNOSIS — E782 Mixed hyperlipidemia: Secondary | ICD-10-CM

## 2021-09-29 DIAGNOSIS — R079 Chest pain, unspecified: Secondary | ICD-10-CM

## 2021-09-29 DIAGNOSIS — I252 Old myocardial infarction: Secondary | ICD-10-CM

## 2021-10-02 ENCOUNTER — Institutional Professional Consult (permissible substitution): Payer: 59 | Admitting: Neurology

## 2021-10-03 ENCOUNTER — Telehealth: Payer: Self-pay | Admitting: Neurology

## 2021-10-03 NOTE — Telephone Encounter (Signed)
Pt was a no show to the sleep consult scheduled for 10/02/21

## 2021-10-12 ENCOUNTER — Ambulatory Visit: Payer: 59 | Admitting: Cardiology

## 2021-10-16 ENCOUNTER — Other Ambulatory Visit: Payer: Self-pay | Admitting: Nurse Practitioner

## 2021-10-16 DIAGNOSIS — K219 Gastro-esophageal reflux disease without esophagitis: Secondary | ICD-10-CM

## 2021-10-20 NOTE — Progress Notes (Signed)
Subjective:   Lauren Carr, female    DOB: Jul 21, 1976, 45 y.o.   MRN: [id number]   Chief complaint:  Chest pain  45 y/o [place] American female with hypertension, hyperlipidemia, family h/o long QT syndrome, NSTEMI with no obstructive CAD on cath (03/2019)  Patient has not had any significant palpitation symptoms since last visit.  Reviewed Apple Watch cardiac monitoring.  No arrhythmias seen.   Current Outpatient Medications on File Prior to Visit  Medication Sig Dispense Refill   amLODipine (NORVASC) 5 MG tablet TAKE 1 TABLET BY MOUTH EVERY DAY 90 tablet 1   aspirin 81 MG EC tablet TAKE 1 TABLET BY MOUTH EVERY DAY 30 tablet 5   atorvastatin (LIPITOR) 80 MG tablet TAKE 1 TABLET BY MOUTH DAILY AT 6 PM. 90 tablet 1   cholecalciferol (VITAMIN D3) 25 MCG (1000 UT) tablet Take 3,000 Units by mouth daily.     Dexlansoprazole 30 MG capsule TAKE 1 CAPSULE BY MOUTH EVERY DAY (NOT COVERED) 30 capsule 2   hydrochlorothiazide (HYDRODIURIL) 12.5 MG tablet TAKE 1 TABLET BY MOUTH EVERY DAY 30 tablet 5   metFORMIN (GLUCOPHAGE) 500 MG tablet TAKE 1 TABLET BY MOUTH 2 TIMES DAILY WITH A MEAL. 60 tablet 2   metoprolol tartrate (LOPRESSOR) 25 MG tablet TAKE HALF A PILL BY MOUTH 2 TIMES DAILY. 30 tablet 17   nitroGLYCERIN (NITROSTAT) 0.4 MG SL tablet PLACE 1 TABLET UNDER THE TONGUE EVERY 5 MINUTES X 3 DOSES AS NEEDED FOR CHEST PAIN. 25 tablet 2   No current facility-administered medications on file prior to visit.    Cardiovascular studies:  EKG 07/10/2021: Sinus rhythm 78 bpm  Leftward axis Old anteroseptal infarct Nonspecific T-abnormality  Coronary angiogram 04/20/2019: LM: Normal LAD: Medium caliber. Mid LAD myocardial bridge. Small diagonal branches. No significant stenoses. LCx: Small caliber. Normal RCA: Dominant. No stenoses LVEDP 22 mmHg   Impression: No angiographically evident coronary artery disease   Recommendation: One year DAPT for medical treatment of NSTEMI    Echocardiogram 04/19/2019:  1. Mild hypokinesis of the left ventricular, basal-mid inferolateral wall.  2. The left ventricle has normal systolic function, with an ejection fraction of 60-65%. Left ventricular diastolic function could not be evaluated.  3. The right ventricle has normal systolc function. The cavity was normal.  4. The aortic valve is tricuspid Aortic valve regurgitation is mild by color flow Doppler.  5. The aortic root and ascending aorta are normal in size and structure.    Recent labs:  07/29/2019: Glucose 95, BUN/Cr 12/0.98. EGFR 81. Na/K 136/4.0.  H/H 11/35. MCV 83. Platelets 256 HbA1C 5.7% Chol 128, TG 98, HDL 54, LDL 54 TSH 1.8 normal      Review of Systems  Cardiovascular:  Positive for palpitations. Negative for chest pain, dyspnea on exertion, leg swelling and syncope.   Vitals:   10/23/21 0845  BP: 135/83  Pulse: 77  Resp: 16  Temp: 98 F (36.7 C)  SpO2: 96%     Physical Exam Vitals and nursing note reviewed.  Constitutional:      General: She is not in acute distress. Neck:     Vascular: No JVD.  Cardiovascular:     Rate and Rhythm: Normal rate and regular rhythm.     Pulses: Intact distal pulses.     Heart sounds: Normal heart sounds. No murmur heard. Pulmonary:     Effort: Pulmonary effort is normal.     Breath sounds: Normal breath sounds. No wheezing or rales.  Musculoskeletal:     Right lower leg: No edema.     Left lower leg: No edema.             Assessment & Recommendations:   45 y/o [place] female with hypertension, hyperlipidemia, family h/o long QT syndrome, NSTEMI with no obstructive CAD on cath (03/2019)  Palpitations: Reviewed cardiologs monitoring. Only sinus rhythm noted. No arrhythmia seen.  H/o NSTEMI 03/2020. No obstructive CAD on coronary angiogram. Continue Aspirin 81 mg. Continue lipitor 80 mg daily, metoprolol 12.5 mg bid, amlodipine 5 mg daily.    Hypertension: Controlled.  Hyperlipidemia: Continue lipitor 80 mg.  Lipids well controlled.  F/u in 1 year   Nigel Mormon, MD [facility] Pager: [phone] Office: [phone]

## 2021-10-23 ENCOUNTER — Encounter: Payer: Self-pay | Admitting: Cardiology

## 2021-10-23 ENCOUNTER — Ambulatory Visit: Payer: 59 | Admitting: Cardiology

## 2021-10-23 ENCOUNTER — Other Ambulatory Visit: Payer: Self-pay

## 2021-10-23 VITALS — BP 135/83 | HR 77 | Temp 98.0°F | Resp 16 | Ht 66.0 in | Wt 237.0 lb

## 2021-10-23 DIAGNOSIS — I10 Essential (primary) hypertension: Secondary | ICD-10-CM

## 2021-10-23 DIAGNOSIS — R002 Palpitations: Secondary | ICD-10-CM

## 2021-10-23 DIAGNOSIS — E782 Mixed hyperlipidemia: Secondary | ICD-10-CM

## 2021-10-23 DIAGNOSIS — I252 Old myocardial infarction: Secondary | ICD-10-CM

## 2021-11-03 ENCOUNTER — Other Ambulatory Visit: Payer: Self-pay | Admitting: Nurse Practitioner

## 2021-11-05 ENCOUNTER — Other Ambulatory Visit: Payer: Self-pay | Admitting: Cardiology

## 2021-11-05 DIAGNOSIS — I10 Essential (primary) hypertension: Secondary | ICD-10-CM

## 2021-11-09 LAB — HM MAMMOGRAPHY

## 2021-11-13 ENCOUNTER — Encounter: Payer: Self-pay | Admitting: Nurse Practitioner

## 2021-11-13 ENCOUNTER — Ambulatory Visit (INDEPENDENT_AMBULATORY_CARE_PROVIDER_SITE_OTHER): Payer: 59 | Admitting: Nurse Practitioner

## 2021-11-13 ENCOUNTER — Other Ambulatory Visit: Payer: Self-pay

## 2021-11-13 VITALS — BP 114/76 | HR 78 | Temp 97.7°F | Ht 66.0 in | Wt 234.2 lb

## 2021-11-13 DIAGNOSIS — Z79899 Other long term (current) drug therapy: Secondary | ICD-10-CM

## 2021-11-13 DIAGNOSIS — R7309 Other abnormal glucose: Secondary | ICD-10-CM | POA: Diagnosis not present

## 2021-11-13 DIAGNOSIS — H6122 Impacted cerumen, left ear: Secondary | ICD-10-CM | POA: Diagnosis not present

## 2021-11-13 DIAGNOSIS — I10 Essential (primary) hypertension: Secondary | ICD-10-CM | POA: Diagnosis not present

## 2021-11-13 DIAGNOSIS — E8881 Metabolic syndrome: Secondary | ICD-10-CM | POA: Diagnosis not present

## 2021-11-13 DIAGNOSIS — E7800 Pure hypercholesterolemia, unspecified: Secondary | ICD-10-CM | POA: Diagnosis not present

## 2021-11-13 DIAGNOSIS — E042 Nontoxic multinodular goiter: Secondary | ICD-10-CM

## 2021-11-13 DIAGNOSIS — Z0000 Encounter for general adult medical examination without abnormal findings: Secondary | ICD-10-CM | POA: Diagnosis not present

## 2021-11-13 LAB — POCT URINALYSIS DIPSTICK
Bilirubin, UA: NEGATIVE
Glucose, UA: NEGATIVE
Ketones, UA: NEGATIVE
Leukocytes, UA: NEGATIVE
Nitrite, UA: NEGATIVE
Protein, UA: NEGATIVE
Spec Grav, UA: 1.025 (ref 1.010–1.025)
Urobilinogen, UA: 0.2 E.U./dL
pH, UA: 6 (ref 5.0–8.0)

## 2021-11-13 LAB — POCT UA - MICROALBUMIN
Albumin/Creatinine Ratio, Urine, POC: 30
Creatinine, POC: 300 mg/dL
Microalbumin Ur, POC: 30 mg/L

## 2021-11-13 MED ORDER — METFORMIN HCL 500 MG PO TABS: 500.0000 mg | ORAL_TABLET | Freq: Two times a day (BID) | ORAL | 1 refills | Status: DC

## 2021-11-13 NOTE — Progress Notes (Signed)
I,Katawbba Wiggins,acting as a Education administrator for [employer], FNP.,have documented all relevant documentation on the behalf of [name], FNP,as directed by  [name], FNP while in the presence of [name], [place].   This visit occurred during the SARS-CoV-2 public health emergency.  Safety protocols were in place, including screening questions prior to the visit, additional usage of staff PPE, and extensive cleaning of exam room while observing appropriate contact time as indicated for disinfecting solutions.  Subjective:     Patient ID: Lauren Carr , female    DOB: February 16, 1976 , 45 y.o.   MRN: [id number]   Chief Complaint  Patient presents with   Annual Exam    HPI  The patient is here today for a physical examination.  The patient is followed by Dr. Garwin Brothers for GYN care.  She has seen the Cardiologist. She has recently relocated to [facility] would like to find a sleep provider to do her sleep study in that area.     Past Medical History:  Diagnosis Date   Hypertension    NSTEMI (non-ST elevated myocardial infarction) ([place])    Obstructive sleep apnea      Family History  Problem Relation Age of Onset   Breast cancer Mother        early 45s   Hypertension Mother    Long QT syndrome Mother        Diagnosed in 29s after syncope. Has ICD placed   Cancer - Prostate Father    Hypertension Father    Cancer - Cervical Maternal Grandmother    Diabetes Maternal Grandmother    CAD Maternal Grandfather    CAD Paternal Grandfather      Current Outpatient Medications:    amLODipine (NORVASC) 5 MG tablet, TAKE 1 TABLET BY MOUTH EVERY DAY, Disp: 90 tablet, Rfl: 1   aspirin 81 MG EC tablet, TAKE 1 TABLET BY MOUTH EVERY DAY, Disp: 30 tablet, Rfl: 5   atorvastatin (LIPITOR) 80 MG tablet, TAKE 1 TABLET BY MOUTH DAILY AT 6 PM., Disp: 90 tablet, Rfl: 1   cholecalciferol (VITAMIN D3) 25 MCG (1000 UT) tablet, Take 3,000 Units by mouth daily., Disp: , Rfl:    Dexlansoprazole 30 MG  capsule, TAKE 1 CAPSULE BY MOUTH EVERY DAY (NOT COVERED), Disp: 30 capsule, Rfl: 2   hydrochlorothiazide (HYDRODIURIL) 12.5 MG tablet, TAKE 1 TABLET BY MOUTH EVERY DAY, Disp: 30 tablet, Rfl: 5   metoprolol tartrate (LOPRESSOR) 25 MG tablet, TAKE HALF A PILL BY MOUTH 2 TIMES DAILY., Disp: 30 tablet, Rfl: 17   nitroGLYCERIN (NITROSTAT) 0.4 MG SL tablet, PLACE 1 TABLET UNDER THE TONGUE EVERY 5 MINUTES X 3 DOSES AS NEEDED FOR CHEST PAIN., Disp: 25 tablet, Rfl: 2   metFORMIN (GLUCOPHAGE) 500 MG tablet, Take 1 tablet (500 mg total) by mouth 2 (two) times daily with a meal., Disp: 180 tablet, Rfl: 1   No Known Allergies    The patient states she uses tubal ligation for birth control. Last LMP was Patient's last menstrual period was 11/08/2021.. has been having Abnormal menstrual cycles for the last 2 years and a regular in May and then spotted last week. Negative for: breast discharge, breast lump(s), breast pain and breast self exam. Associated symptoms include abnormal vaginal bleeding. Pertinent negatives include abnormal bleeding (hematology), anxiety, decreased libido, depression, difficulty falling sleep, dyspareunia, history of infertility, nocturia, sexual dysfunction, sleep disturbances, urinary incontinence, urinary urgency, vaginal discharge and vaginal itching. Diet regular; it  is better with the metformin she  is not as hungry.  The patient states her exercise level is minimal - 2 days a week was up to 4 days.   Wt Readings from Last 3 Encounters:  11/13/21 234 lb 3.2 oz (106.2 kg)  10/23/21 237 lb (107.5 kg)  07/10/21 245 lb (111.1 kg)     . The patient's tobacco use is:  Social History   Tobacco Use  Smoking Status Never  Smokeless Tobacco Never  . She has been exposed to passive smoke. The patient's alcohol use is:  Social History   Substance and Sexual Activity  Alcohol Use Not Currently   Alcohol/week: 0.0 standard drinks  . Additional information: Last pap 10/04/2020 - she  reports she had a full pap with Dr. Garwin Brothers in 2022, next one scheduled for 2023.    Review of Systems  Constitutional: Negative.   HENT: Negative.    Eyes: Negative.   Respiratory: Negative.    Cardiovascular: Negative.   Gastrointestinal: Negative.   Endocrine: Negative.   Genitourinary: Negative.   Musculoskeletal: Negative.   Skin: Negative.   Allergic/Immunologic: Negative.   Neurological: Negative.   Hematological: Negative.   Psychiatric/Behavioral: Negative.      Today's Vitals   11/13/21 1547  BP: 114/76  Pulse: 78  Temp: 97.7 F (36.5 C)  Weight: 234 lb 3.2 oz (106.2 kg)  Height: _0  (1.676 m)   Body mass index is 37.8 kg/m.  Wt Readings from Last 3 Encounters:  11/13/21 234 lb 3.2 oz (106.2 kg)  10/23/21 237 lb (107.5 kg)  07/10/21 245 lb (111.1 kg)    BP Readings from Last 3 Encounters:  11/13/21 114/76  10/23/21 135/83  07/10/21 128/71    Objective:  Physical Exam Vitals reviewed.  Constitutional:      General: She is not in acute distress.    Appearance: Normal appearance. She is well-developed. She is obese.  HENT:     Head: Normocephalic and atraumatic.     Right Ear: Hearing, tympanic membrane, ear canal and external ear normal. There is no impacted cerumen.     Left Ear: Hearing and external ear normal. There is impacted cerumen (soft cerumen present).     Nose:     Comments: Deferred- masked    Mouth/Throat:     Comments: Deferred - masked Eyes:     General: Lids are normal.     Extraocular Movements: Extraocular movements intact.     Conjunctiva/sclera: Conjunctivae normal.     Pupils: Pupils are equal, round, and reactive to light.     Funduscopic exam:    Right eye: No papilledema.        Left eye: No papilledema.  Neck:     Thyroid: No thyroid mass.     Vascular: No carotid bruit.     Comments: Neck fullness Cardiovascular:     Rate and Rhythm: Normal rate and regular rhythm.     Pulses: Normal pulses.     Heart sounds:  Normal heart sounds. No murmur heard. Pulmonary:     Effort: Pulmonary effort is normal. No respiratory distress.     Breath sounds: Normal breath sounds. No wheezing.  Chest:  Breasts:    Right: Normal. No mass, nipple discharge or tenderness.     Left: Normal. No mass, nipple discharge or tenderness.  Abdominal:     General: Abdomen is flat. Bowel sounds are normal.     Palpations: Abdomen is soft.  Musculoskeletal:        General: No  swelling or tenderness. Normal range of motion.     Cervical back: Full passive range of motion without pain, normal range of motion and neck supple.     Right lower leg: No edema.     Left lower leg: No edema.  Lymphadenopathy:     Upper Body:     Right upper body: No supraclavicular, axillary or pectoral adenopathy.     Left upper body: No supraclavicular, axillary or pectoral adenopathy.  Skin:    General: Skin is warm and dry.     Capillary Refill: Capillary refill takes less than 2 seconds.  Neurological:     General: No focal deficit present.     Mental Status: She is alert and oriented to person, place, and time.     Cranial Nerves: No cranial nerve deficit.     Sensory: No sensory deficit.     Motor: No weakness.  Psychiatric:        Mood and Affect: Mood normal.        Behavior: Behavior normal.        Thought Content: Thought content normal.        Judgment: Judgment normal.        Assessment And Plan:     1. Encounter for general adult medical examination w/o abnormal findings  2. Essential hypertension Comments: Blood pressure is well controlled, continue current medications and follow up with Cardiology - POCT Urinalysis Dipstick (81002) - POCT UA - Microalbumin  3. Abnormal glucose Comments: Continue current medications. Tolerating well - Hemoglobin A1c - CMP14+EGFR - metFORMIN (GLUCOPHAGE) 500 MG tablet; Take 1 tablet (500 mg total) by mouth 2 (two) times daily with a meal.  Dispense: 180 tablet; Refill: 1  4.  Elevated cholesterol Comments: Continue statin, tolerating well.  - CMP14+EGFR - Lipid panel  5. Other long term (current) drug therapy - CBC  6. Insulin resistance Comments: Continue to limit high sugar/carb diet and continue metformin - Insulin, random  7. Multiple thyroid nodules Comments: Will recheck thyroid ultrasound - US THYROID; Future  8. Impacted cerumen of left ear Comments: Firm cerumen present to ear canal, removed cerumen with curette. Instructions for home care to prevent wax buildup are given.  - EAR CERUMEN REMOVAL    Patient was given opportunity to ask questions. Patient verbalized understanding of the plan and was able to repeat key elements of the plan. All questions were answered to their satisfaction.   [name], FNP   I, [name], FNP, have reviewed all documentation for this visit. The documentation on 11/13/21 for the exam, diagnosis, procedures, and orders are all accurate and complete.   THE PATIENT IS ENCOURAGED TO PRACTICE SOCIAL DISTANCING DUE TO THE COVID-19 PANDEMIC.

## 2021-11-14 LAB — CMP14+EGFR
ALT: 18 IU/L (ref 0–32)
AST: 18 IU/L (ref 0–40)
Albumin/Globulin Ratio: 1.5 (ref 1.2–2.2)
Albumin: 4.9 g/dL — ABNORMAL HIGH (ref 3.8–4.8)
Alkaline Phosphatase: 84 IU/L (ref 44–121)
BUN/Creatinine Ratio: 14 (ref 9–23)
BUN: 12 mg/dL (ref 6–24)
Bilirubin Total: 0.7 mg/dL (ref 0.0–1.2)
CO2: 25 mmol/L (ref 20–29)
Calcium: 9.9 mg/dL (ref 8.7–10.2)
Chloride: 99 mmol/L (ref 96–106)
Creatinine, Ser: 0.87 mg/dL (ref 0.57–1.00)
Globulin, Total: 3.3 g/dL (ref 1.5–4.5)
Glucose: 90 mg/dL (ref 70–99)
Potassium: 3.8 mmol/L (ref 3.5–5.2)
Sodium: 140 mmol/L (ref 134–144)
Total Protein: 8.2 g/dL (ref 6.0–8.5)
eGFR: 83 mL/min/{1.73_m2} (ref >59)

## 2021-11-14 LAB — CBC
Hematocrit: 37.1 % (ref 34.0–46.6)
Hemoglobin: 11.8 g/dL (ref 11.1–15.9)
MCH: 25.7 pg — ABNORMAL LOW (ref 26.6–33.0)
MCHC: 31.8 g/dL (ref 31.5–35.7)
MCV: 81 fL (ref 79–97)
Platelets: 275 10*3/uL (ref 150–450)
RBC: 4.59 x10E6/uL (ref 3.77–5.28)
RDW: 14.8 % (ref 11.7–15.4)
WBC: 4.9 10*3/uL (ref 3.4–10.8)

## 2021-11-14 LAB — LIPID PANEL
Chol/HDL Ratio: 2.3 ratio (ref 0.0–4.4)
Cholesterol, Total: 156 mg/dL (ref 100–199)
HDL: 67 mg/dL (ref >39)
LDL Chol Calc (NIH): 70 mg/dL (ref 0–99)
Triglycerides: 104 mg/dL (ref 0–149)
VLDL Cholesterol Cal: 19 mg/dL (ref 5–40)

## 2021-11-14 LAB — INSULIN, RANDOM: INSULIN: 14.8 u[IU]/mL (ref 2.6–24.9)

## 2021-11-14 LAB — HEMOGLOBIN A1C
Est. average glucose Bld gHb Est-mCnc: 117 mg/dL
Hgb A1c MFr Bld: 5.7 % — ABNORMAL HIGH (ref 4.8–5.6)

## 2021-11-15 ENCOUNTER — Encounter: Payer: Self-pay | Admitting: Nurse Practitioner

## 2021-11-21 ENCOUNTER — Encounter: Payer: Self-pay | Admitting: Gastroenterology

## 2021-12-01 ENCOUNTER — Other Ambulatory Visit: Payer: Self-pay

## 2021-12-01 ENCOUNTER — Ambulatory Visit
Admission: RE | Admit: 2021-12-01 | Discharge: 2021-12-01 | Disposition: A | Discharge status: Home / Self Care | Payer: 59 | Source: Ambulatory Visit | Attending: Nurse Practitioner | Admitting: Nurse Practitioner

## 2021-12-01 DIAGNOSIS — E042 Nontoxic multinodular goiter: Secondary | ICD-10-CM

## 2021-12-01 IMAGING — US US THYROID
1 series · 13 of 25 positions shown · non-contrast
Comparison: [DATE]

CLINICAL DATA: Thyroid nodules

EXAM:
THYROID ULTRASOUND
TECHNIQUE: Ultrasound examination of the thyroid gland and adjacent soft
tissues was performed.

[Series 1: us thyroid · 0.07mm/px · 13 of 42 slices shown]
[im 1/42]
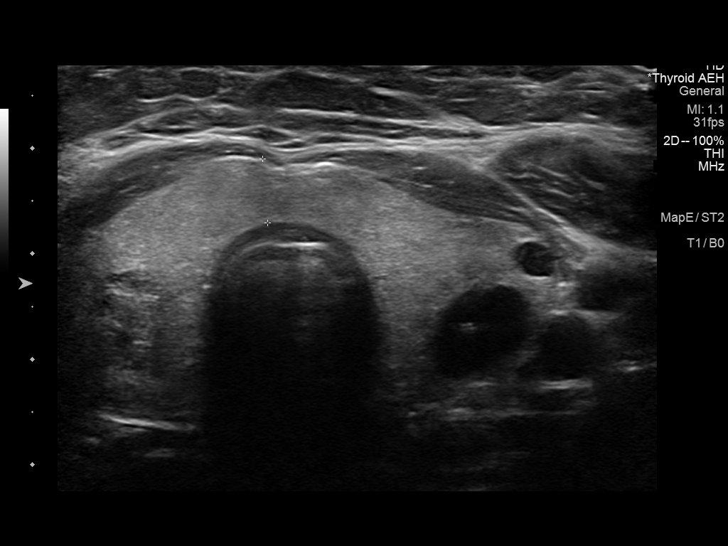
[im 4/42]
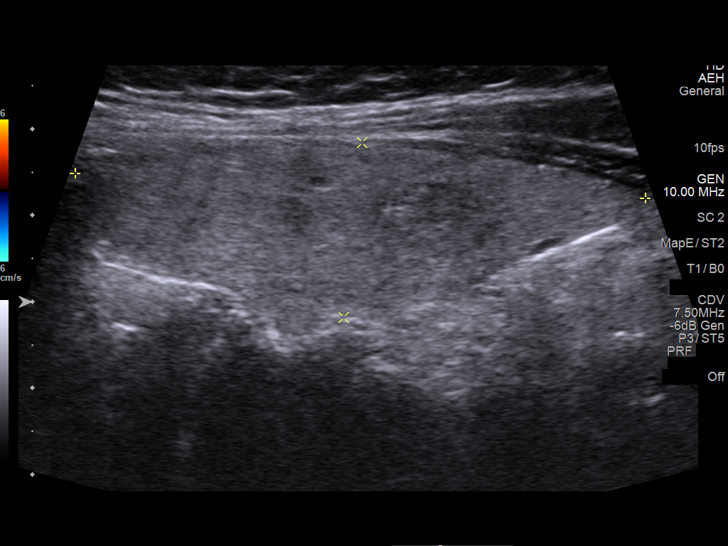
[im 7/42]
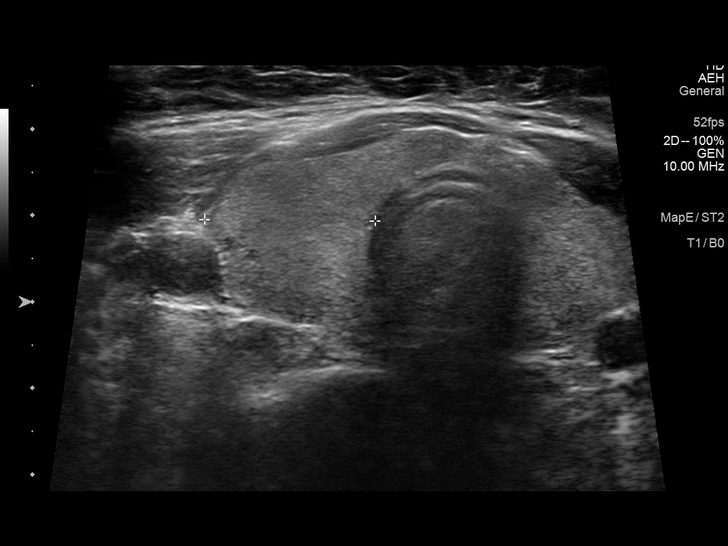
[im 11/42]
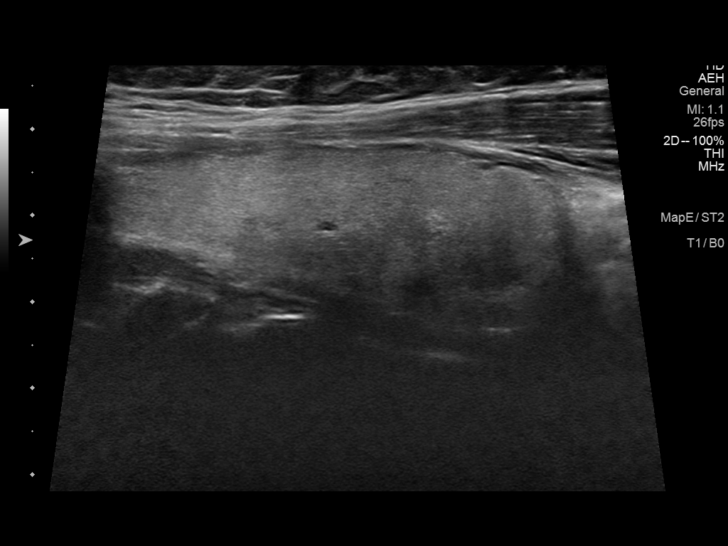
[im 14/42]
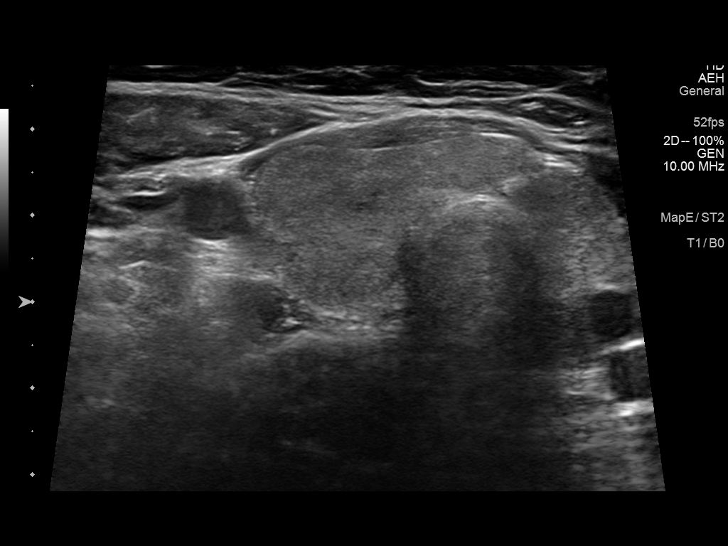
[im 18/42]
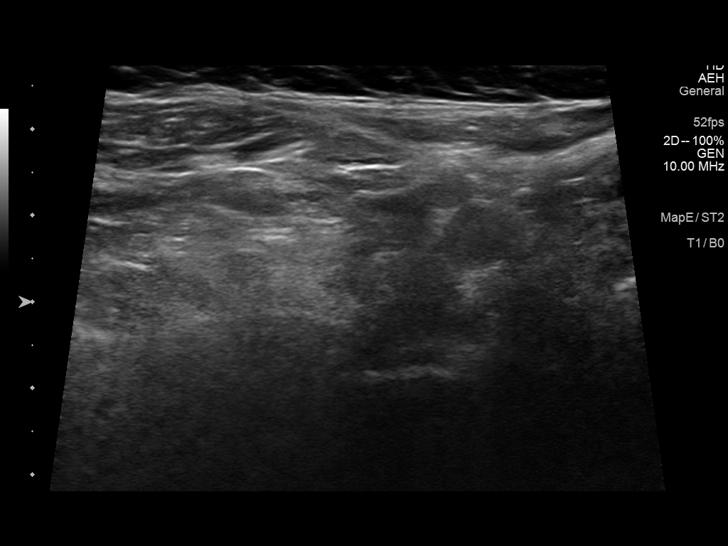
[im 21/42]
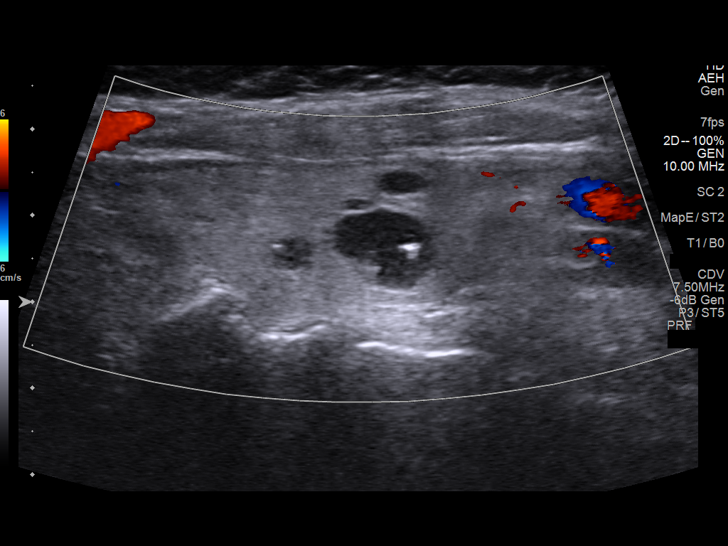
[im 24/42]
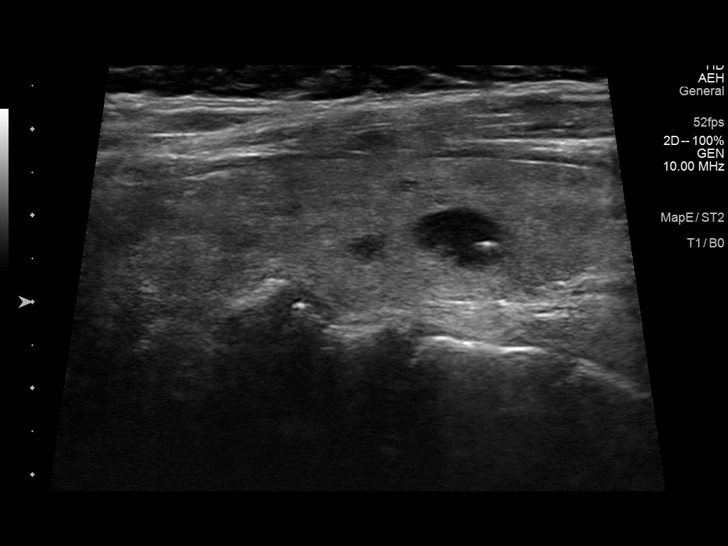
[im 28/42]
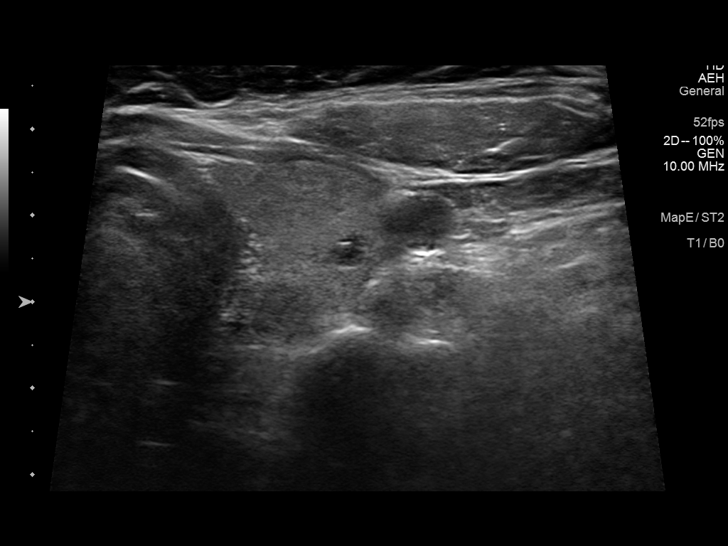
[im 31/42]
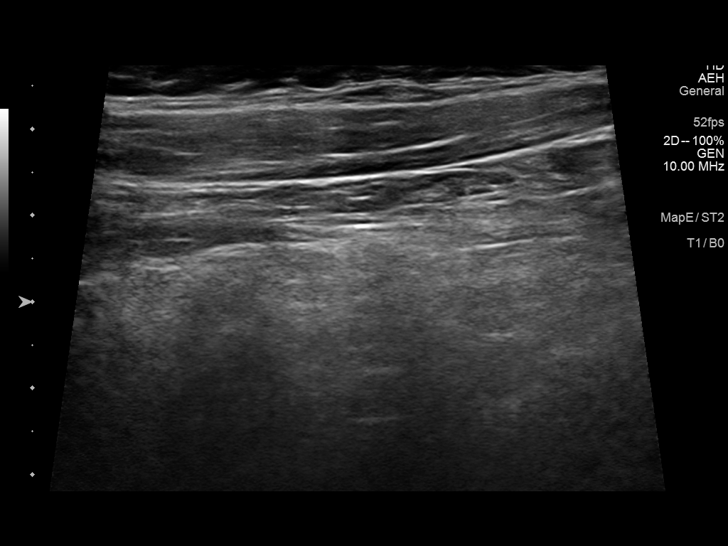
[im 35/42]
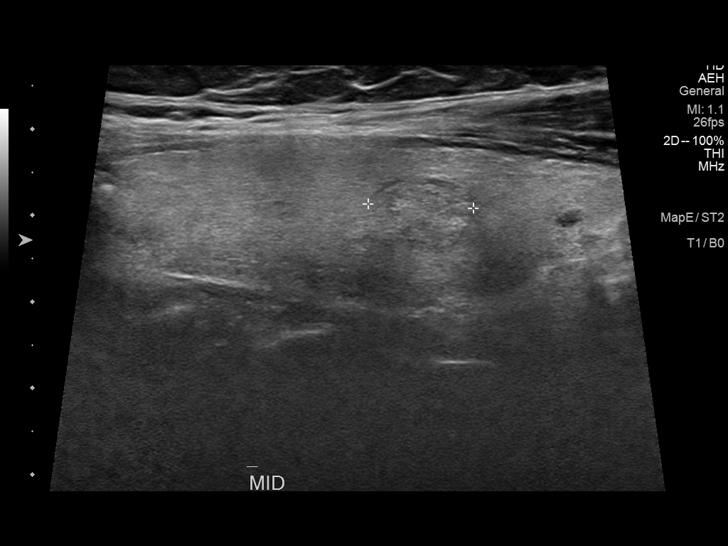
[im 38/42]
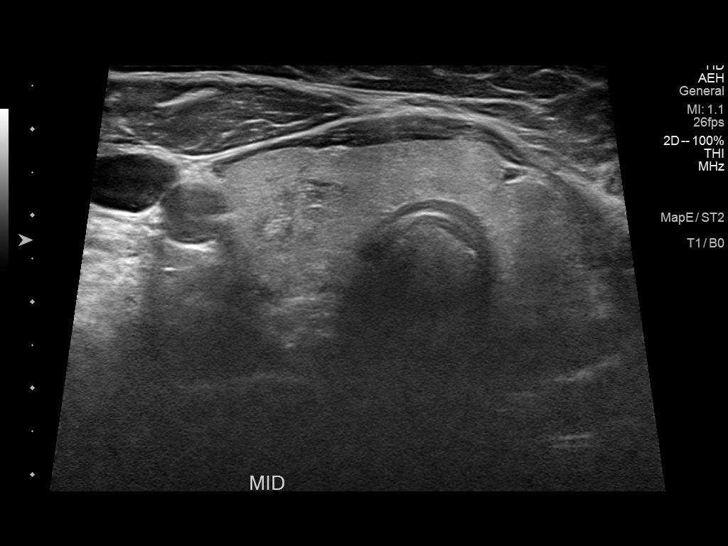
[im 42/42]
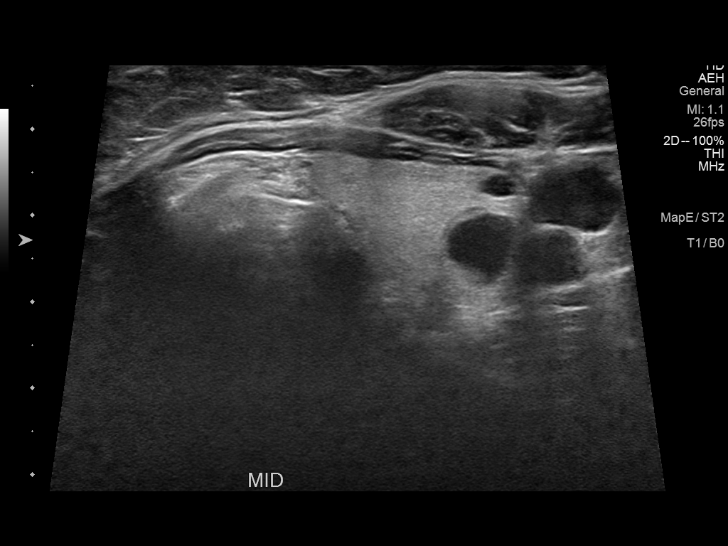

[13 of 25 positions shown; findings below may reference images not displayed]

FINDINGS: Parenchymal Echotexture: Mildly heterogenous

Isthmus: 6 mm

Right lobe: 6.6 x 2.0 x 2.0 cm

Left lobe: 7.0 x 1.7 x 2.1 cm

_________________________________________________________

Estimated total number of nodules >/= 1 cm: 2

Number of spongiform nodules >/=  2 cm not described below (TR1): 0

Number of mixed cystic and solid nodules >/= 1.5 cm not described
below (TR2): 0

_________________________________________________________

Nodule 1 represents a solid isoechoic TR 3 type nodule measuring
cm in greatest dimension. This would not meet criteria for any
biopsy or follow-up.

Nodule 2 represents a mixed cystic/solid TR 2 type nodule measuring
only 1.2 cm. This also would not meet criteria for any biopsy or
follow-up.

There are a few additional scattered subcentimeter hypoechoic and
cystic nodules noted, all measuring 5 mm or less in size.

No hypervascularity.  No regional adenopathy.
IMPRESSION: Small bilateral thyroid nodules predominately cystic. See above
comment. No significant nodule that warrants biopsy or follow-up.

The above is in keeping with the ACR TI-RADS recommendations - [HOSPITAL] [B3];[DATE].

## 2022-01-04 ENCOUNTER — Ambulatory Visit (AMBULATORY_SURGERY_CENTER): Payer: 59 | Admitting: *Deleted

## 2022-01-04 VITALS — Ht 68.5 in | Wt 233.0 lb

## 2022-01-04 DIAGNOSIS — Z1211 Encounter for screening for malignant neoplasm of colon: Secondary | ICD-10-CM

## 2022-01-04 MED ORDER — NA SULFATE-K SULFATE-MG SULF 17.5-3.13-1.6 GM/177ML PO SOLN: 2.0000 | Freq: Once | ORAL | 0 refills | Status: AC

## 2022-01-04 NOTE — Progress Notes (Signed)
No egg or soy allergy known to patient   issues known to pt with past sedation with any surgeries or procedures of ponv. Patient denies ever being told they had issues or difficulty with intubation  No FH of Malignant Hyperthermia Pt is not on diet pills Pt is not on  home 02  Pt is not on blood thinners  Pt denies issues with constipation  No A fib or A flutter  Pt is fully vaccinated  for Covid    Due to the COVID-19 pandemic we are asking patients to follow certain guidelines in PV and the [facility]   Pt aware of COVID protocols and [facility] guidelines   PV completed over the phone. Pt verified name, DOB, address and insurance during PV today.   Pt encouraged to call with questions or issues.  If pt has My chart, procedure instructions sent via My Chart

## 2022-01-12 ENCOUNTER — Encounter: Payer: Self-pay | Admitting: Gastroenterology

## 2022-01-18 ENCOUNTER — Encounter: Payer: Self-pay | Admitting: Gastroenterology

## 2022-01-18 ENCOUNTER — Ambulatory Visit (AMBULATORY_SURGERY_CENTER): Payer: 59 | Admitting: Gastroenterology

## 2022-01-18 VITALS — BP 118/67 | HR 67 | Temp 97.6°F | Resp 15 | Ht 66.0 in | Wt 233.0 lb

## 2022-01-18 DIAGNOSIS — Z1211 Encounter for screening for malignant neoplasm of colon: Secondary | ICD-10-CM | POA: Diagnosis present

## 2022-01-18 DIAGNOSIS — D125 Benign neoplasm of sigmoid colon: Secondary | ICD-10-CM

## 2022-01-18 MED ORDER — SODIUM CHLORIDE 0.9 % IV SOLN: 500.0000 mL | Freq: Once | INTRAVENOUS | Status: DC

## 2022-01-18 NOTE — Progress Notes (Signed)
VS-CW  Pt's states no medical or surgical changes since previsit or office visit.  

## 2022-01-18 NOTE — Progress Notes (Signed)
A and O x3. Report to RN. Tolerated MAC anesthesia well.

## 2022-01-18 NOTE — Patient Instructions (Signed)
Please read handouts provided. Continue present medications. Await pathology results.   YOU HAD AN ENDOSCOPIC PROCEDURE TODAY AT THE [facility]:   Refer to the procedure report that was given to you for any specific questions about what was found during the examination.  If the procedure report does not answer your questions, please call your gastroenterologist to clarify.  If you requested that your care partner not be given the details of your procedure findings, then the procedure report has been included in a sealed envelope for you to review at your convenience later.  YOU SHOULD EXPECT: Some feelings of bloating in the abdomen. Passage of more gas than usual.  Walking can help get rid of the air that was put into your GI tract during the procedure and reduce the bloating. If you had a lower endoscopy (such as a colonoscopy or flexible sigmoidoscopy) you may notice spotting of blood in your stool or on the toilet paper. If you underwent a bowel prep for your procedure, you may not have a normal bowel movement for a few days.  Please Note:  You might notice some irritation and congestion in your nose or some drainage.  This is from the oxygen used during your procedure.  There is no need for concern and it should clear up in a day or so.  SYMPTOMS TO REPORT IMMEDIATELY:  Following lower endoscopy (colonoscopy or flexible sigmoidoscopy):  Excessive amounts of blood in the stool  Significant tenderness or worsening of abdominal pains  Swelling of the abdomen that is new, acute  Fever of 100F or higher   For urgent or emergent issues, a gastroenterologist can be reached at any hour by calling [phone]. Do not use MyChart messaging for urgent concerns.    DIET:  We do recommend a small meal at first, but then you may proceed to your regular diet.  Drink plenty of fluids but you should avoid alcoholic beverages for 24 hours.  ACTIVITY:  You should plan to take it easy  for the rest of today and you should NOT DRIVE or use heavy machinery until tomorrow (because of the sedation medicines used during the test).    FOLLOW UP: Our staff will call the number listed on your records 48-72 hours following your procedure to check on you and address any questions or concerns that you may have regarding the information given to you following your procedure. If we do not reach you, we will leave a message.  We will attempt to reach you two times.  During this call, we will ask if you have developed any symptoms of COVID 19. If you develop any symptoms (ie: fever, flu-like symptoms, shortness of breath, cough etc.) before then, please call [phone].  If you test positive for Covid 19 in the 2 weeks post procedure, please call and report this information to us.    If any biopsies were taken you will be contacted by phone or by letter within the next 1-3 weeks.  Please call us at [phone] if you have not heard about the biopsies in 3 weeks.    SIGNATURES/CONFIDENTIALITY: You and/or your care partner have signed paperwork which will be entered into your electronic medical record.  These signatures attest to the fact that that the information above on your After Visit Summary has been reviewed and is understood.  Full responsibility of the confidentiality of this discharge information lies with you and/or your care-partner.  

## 2022-01-18 NOTE — Progress Notes (Signed)
Called to room to assist during endoscopic procedure.  Patient ID and intended procedure confirmed with present staff. Received instructions for my participation in the procedure from the performing physician.  

## 2022-01-18 NOTE — Op Note (Signed)
[facility] Patient Name: Lauren Carr Procedure Date: 01/18/2022 10:33 AM MRN: [id number] Endoscopist: Mauri Pole , MD Age: 46 Referring MD:  Date of Birth: 1976-10-26 Gender: Female Account #: [id number] Procedure:                Colonoscopy Indications:              Screening for colorectal malignant neoplasm Medicines:                Monitored Anesthesia Care Procedure:                Pre-Anesthesia Assessment:                           - Prior to the procedure, a History and Physical                            was performed, and patient medications and                            allergies were reviewed. The patient's tolerance of                            previous anesthesia was also reviewed. The risks                            and benefits of the procedure and the sedation                            options and risks were discussed with the patient.                            All questions were answered, and informed consent                            was obtained. Prior Anticoagulants: The patient has                            taken no previous anticoagulant or antiplatelet                            agents. ASA Grade Assessment: III - A patient with                            severe systemic disease. After reviewing the risks                            and benefits, the patient was deemed in                            satisfactory condition to undergo the procedure.                           After obtaining informed consent, the colonoscope  was passed under direct vision. Throughout the                            procedure, the patient's blood pressure, pulse, and                            oxygen saturations were monitored continuously. The                            Olympus [id number]-[id number]) Colonoscope was                            introduced through the anus and advanced to the the                            cecum,  identified by appendiceal orifice and                            ileocecal valve. The colonoscopy was performed                            without difficulty. The patient tolerated the                            procedure well. The quality of the bowel                            preparation was excellent. The ileocecal valve,                            appendiceal orifice, and rectum were photographed. Scope In: 11:03:24 AM Scope Out: 11:19:27 AM Scope Withdrawal Time: 0 hours 10 minutes 46 seconds  Total Procedure Duration: 0 hours 16 minutes 3 seconds  Findings:                 The perianal and digital rectal examinations were                            normal.                           Two sessile polyps were found in the sigmoid colon.                            The polyps were 5 to 6 mm in size. These polyps                            were removed with a cold snare. Resection and                            retrieval were complete.                           Non-bleeding internal hemorrhoids were found during  retroflexion. The hemorrhoids were medium-sized. Complications:            No immediate complications. Estimated Blood Loss:     Estimated blood loss was minimal. Impression:               - Two 5 to 6 mm polyps in the sigmoid colon,                            removed with a cold snare. Resected and retrieved.                           - Non-bleeding internal hemorrhoids. Recommendation:           - Patient has a contact number available for                            emergencies. The signs and symptoms of potential                            delayed complications were discussed with the                            patient. Return to normal activities tomorrow.                            Written discharge instructions were provided to the                            patient.                           - Resume previous diet.                           -  Continue present medications.                           - Await pathology results.                           - Repeat colonoscopy in 5-10 years for surveillance                            based on pathology results. Mauri Pole, MD 01/18/2022 11:25:58 AM This report has been signed electronically.

## 2022-01-18 NOTE — Progress Notes (Signed)
[facility] History and Physical   Primary Care Physician:  [name], FNP   Reason for Procedure:  Colorectal cancer screening  Plan:    Screening colonoscopy with possible interventions as needed     HPI: Lauren Carr is a very pleasant 46 y.o. female here for screening colonoscopy. Denies any nausea, vomiting, abdominal pain, melena or bright red blood per rectum  The risks and benefits as well as alternatives of endoscopic procedure(s) have been discussed and reviewed. All questions answered. The patient agrees to proceed.    Past Medical History:  Diagnosis Date   Diabetes mellitus without complication (HCC)    GERD (gastroesophageal reflux disease)    Heart murmur    Hyperlipidemia    Hypertension    NSTEMI (non-ST elevated myocardial infarction) ([place])    Obstructive sleep apnea    Oxygen deficiency    PONV (postoperative nausea and vomiting)    Sleep apnea     Past Surgical History:  Procedure Laterality Date   CESAREAN SECTION     CHOLECYSTECTOMY     LAPAROSCOPY  1998   LEFT HEART CATH AND CORONARY ANGIOGRAPHY N/A 04/20/2019   Procedure: LEFT HEART CATH AND CORONARY ANGIOGRAPHY;  Surgeon: Nigel Mormon, MD;  Location: [facility];  Service: Cardiovascular;  Laterality: N/A;    Prior to Admission medications   Medication Sig Start Date End Date Taking? Authorizing Provider  amLODipine (NORVASC) 5 MG tablet TAKE 1 TABLET BY MOUTH EVERY DAY 09/29/21   Patwardhan, Manish J, MD  aspirin 81 MG EC tablet TAKE 1 TABLET BY MOUTH EVERY DAY 07/17/21   Patwardhan, Manish J, MD  atorvastatin (LIPITOR) 80 MG tablet TAKE 1 TABLET BY MOUTH DAILY AT 6 PM. 09/29/21   Patwardhan, Manish J, MD  cholecalciferol (VITAMIN D3) 25 MCG (1000 UT) tablet Take 3,000 Units by mouth daily.    [provider]  Dexlansoprazole 30 MG capsule TAKE 1 CAPSULE BY MOUTH EVERY DAY (NOT COVERED) 10/16/21   [name], FNP  hydrochlorothiazide  (HYDRODIURIL) 12.5 MG tablet TAKE 1 TABLET BY MOUTH EVERY DAY 08/01/21   [name], FNP  metFORMIN (GLUCOPHAGE) 500 MG tablet Take 1 tablet (500 mg total) by mouth 2 (two) times daily with a meal. 11/13/21   [name], FNP  metoprolol tartrate (LOPRESSOR) 25 MG tablet TAKE HALF A PILL BY MOUTH 2 TIMES DAILY. 11/06/21   Patwardhan, Reynold Bowen, MD  Multiple Vitamin (MULTIVITAMIN) tablet Take 1 tablet by mouth daily.    [provider]  nitroGLYCERIN (NITROSTAT) 0.4 MG SL tablet PLACE 1 TABLET UNDER THE TONGUE EVERY 5 MINUTES X 3 DOSES AS NEEDED FOR CHEST PAIN. 12/19/20   Patwardhan, Reynold Bowen, MD    Current Outpatient Medications  Medication Sig Dispense Refill   amLODipine (NORVASC) 5 MG tablet TAKE 1 TABLET BY MOUTH EVERY DAY 90 tablet 1   aspirin 81 MG EC tablet TAKE 1 TABLET BY MOUTH EVERY DAY 30 tablet 5   atorvastatin (LIPITOR) 80 MG tablet TAKE 1 TABLET BY MOUTH DAILY AT 6 PM. 90 tablet 1   cholecalciferol (VITAMIN D3) 25 MCG (1000 UT) tablet Take 3,000 Units by mouth daily.     Dexlansoprazole 30 MG capsule TAKE 1 CAPSULE BY MOUTH EVERY DAY (NOT COVERED) 30 capsule 2   hydrochlorothiazide (HYDRODIURIL) 12.5 MG tablet TAKE 1 TABLET BY MOUTH EVERY DAY 30 tablet 5   metFORMIN (GLUCOPHAGE) 500 MG tablet Take 1 tablet (500 mg total) by mouth 2 (two) times daily  with a meal. 180 tablet 1   metoprolol tartrate (LOPRESSOR) 25 MG tablet TAKE HALF A PILL BY MOUTH 2 TIMES DAILY. 30 tablet 17   Multiple Vitamin (MULTIVITAMIN) tablet Take 1 tablet by mouth daily.     nitroGLYCERIN (NITROSTAT) 0.4 MG SL tablet PLACE 1 TABLET UNDER THE TONGUE EVERY 5 MINUTES X 3 DOSES AS NEEDED FOR CHEST PAIN. 25 tablet 2   Current Facility-Administered Medications  Medication Dose Route Frequency Provider Last Rate Last Admin   0.9 %  sodium chloride infusion  500 mL Intravenous Once Mauri Pole, MD        Allergies as of 01/18/2022   (No Known Allergies)    Family History  Problem Relation  Age of Onset   Colon polyps Mother    Breast cancer Mother        early 53s   Hypertension Mother    Long QT syndrome Mother        Diagnosed in 42s after syncope. Has ICD placed   Cancer - Prostate Father    Hypertension Father    Cancer - Cervical Maternal Grandmother    Diabetes Maternal Grandmother    CAD Maternal Grandfather    CAD Paternal Grandfather    Colon cancer Neg Hx    Esophageal cancer Neg Hx    Rectal cancer Neg Hx    Stomach cancer Neg Hx     Social History   Socioeconomic History   Marital status: Married    Spouse name: Not on file   Number of children: 3   Years of education: Not on file   Highest education level: Not on file  Occupational History   Not on file  Tobacco Use   Smoking status: Never   Smokeless tobacco: Never  Vaping Use   Vaping Use: Never used  Substance and Sexual Activity   Alcohol use: Not Currently    Comment: occasionally   Drug use: No   Sexual activity: Not on file  Other Topics Concern   Not on file  Social History Narrative   Not on file   Social Determinants of Health   Financial Resource Strain: Not on file  Food Insecurity: Not on file  Transportation Needs: Not on file  Physical Activity: Not on file  Stress: Not on file  Social Connections: Not on file  Intimate Partner Violence: Not on file    Review of Systems:  All other review of systems negative except as mentioned in the HPI.  Physical Exam: Vital signs in last 24 hours: BP (!) 110/58    Pulse 73    Temp 97.6 F (36.4 C)    Ht 5\' 6"  (1.676 m)    Wt 233 lb (105.7 kg)    SpO2 97%    BMI 37.61 kg/m  General:   Alert, NAD Lungs:  Clear .   Heart:  Regular rate and rhythm Abdomen:  Soft, nontender and nondistended. Neuro/Psych:  Alert and cooperative. Normal mood and affect. A and O x 3  Reviewed labs, radiology imaging, old records and pertinent past GI work up  Patient is appropriate for planned procedure(s) and anesthesia in an ambulatory  setting   K. Denzil Magnuson , MD [phone]

## 2022-01-22 ENCOUNTER — Telehealth: Payer: Self-pay | Admitting: *Deleted

## 2022-01-22 ENCOUNTER — Telehealth: Payer: Self-pay

## 2022-01-22 NOTE — Telephone Encounter (Signed)
°  Follow up Call-  Call back number 01/18/2022  Post procedure Call Back phone  # [phone]  Permission to leave phone message Yes  Some recent data might be hidden     Patient questions:  Do you have a fever, pain , or abdominal swelling? No. Pain Score  0 *  Have you tolerated food without any problems? Yes.    Have you been able to return to your normal activities? Yes.    Do you have any questions about your discharge instructions: Diet   No. Medications  No. Follow up visit  No.  Do you have questions or concerns about your Care? No.  Actions: * If pain score is 4 or above: No action needed, pain <4.

## 2022-01-22 NOTE — Telephone Encounter (Signed)
No answer for post procedure call back. Left VM. 

## 2022-01-25 ENCOUNTER — Other Ambulatory Visit: Payer: Self-pay | Admitting: Cardiology

## 2022-01-25 ENCOUNTER — Other Ambulatory Visit: Payer: Self-pay | Admitting: Nurse Practitioner

## 2022-01-25 DIAGNOSIS — I214 Non-ST elevation (NSTEMI) myocardial infarction: Secondary | ICD-10-CM

## 2022-01-25 DIAGNOSIS — K219 Gastro-esophageal reflux disease without esophagitis: Secondary | ICD-10-CM

## 2022-02-06 ENCOUNTER — Encounter: Payer: Self-pay | Admitting: Gastroenterology

## 2022-02-12 ENCOUNTER — Other Ambulatory Visit: Payer: Self-pay | Admitting: Nurse Practitioner

## 2022-02-20 ENCOUNTER — Other Ambulatory Visit: Payer: Self-pay | Admitting: Nurse Practitioner

## 2022-02-20 DIAGNOSIS — K219 Gastro-esophageal reflux disease without esophagitis: Secondary | ICD-10-CM

## 2022-02-22 ENCOUNTER — Other Ambulatory Visit: Payer: Self-pay

## 2022-02-22 ENCOUNTER — Ambulatory Visit: Payer: 59 | Admitting: Nurse Practitioner

## 2022-02-22 VITALS — BP 132/74 | HR 80 | Temp 98.3°F | Ht 66.0 in | Wt 240.0 lb

## 2022-02-22 DIAGNOSIS — Z6838 Body mass index (BMI) 38.0-38.9, adult: Secondary | ICD-10-CM

## 2022-02-22 NOTE — Patient Instructions (Addendum)
Obesity, Adult ?Obesity is having too much body fat. Being obese means that your weight is more than what is healthy for you.  ?BMI (body mass index) is a number that explains how much body fat you have. If you have a BMI of 30 or more, you are obese. ?Obesity can cause serious health problems, such as: ?Stroke. ?Coronary artery disease (CAD). ?Type 2 diabetes. ?Some types of cancer. ?High blood pressure (hypertension). ?High cholesterol. ?Gallbladder stones. ?Obesity can also contribute to: ?Osteoarthritis. ?Sleep apnea. ?Infertility problems. ?What are the causes? ?Eating meals each day that are high in calories, sugar, and fat. ?Drinking a lot of drinks that have sugar in them. ?Being born with genes that may make you more likely to become obese. ?Having a medical condition that causes obesity. ?Taking certain medicines. ?Sitting a lot (having a sedentary lifestyle). ?Not getting enough sleep. ?What increases the risk? ?Having a family history of obesity. ?Living in an area with limited access to: ?Parks, recreation centers, or sidewalks. ?Healthy food choices, such as grocery stores and farmers' markets. ?What are the signs or symptoms? ?The main sign is having too much body fat. ?How is this treated? ?Treatment for this condition often includes changing your lifestyle. Treatment may include: ?Changing your diet. This may include making a healthy meal plan. ?Exercise. This may include activity that causes your heart to beat faster (aerobic exercise) and strength training. Work with your doctor to design a program that works for you. ?Medicine to help you lose weight. This may be used if you are not able to lose one pound a week after 6 weeks of healthy eating and more exercise. ?Treating conditions that cause the obesity. ?Surgery. Options may include gastric banding and gastric bypass. This may be done if: ?Other treatments have not helped to improve your condition. ?You have a BMI of 40 or higher. ?You have  life-threatening health problems related to obesity. ?Follow these instructions at home: ?Eating and drinking ? ?Follow advice from your doctor about what to eat and drink. Your doctor may tell you to: ?Limit fast food, sweets, and processed snack foods. ?Choose low-fat options. For example, choose low-fat milk instead of whole milk. ?Eat five or more servings of fruits or vegetables each day. ?Eat at home more often. This gives you more control over what you eat. ?Choose healthy foods when you eat out. ?Learn to read food labels. This will help you learn how much food is in one serving. ?Keep low-fat snacks available. ?Avoid drinks that have a lot of sugar in them. These include soda, fruit juice, iced tea with sugar, and flavored milk. ?Drink enough water to keep your pee (urine) pale yellow. ?Do not go on fad diets. ?Physical activity ?Exercise often, as told by your doctor. Most adults should get up to 150 minutes of moderate-intensity exercise every week.Ask your doctor: ?What types of exercise are safe for you. ?How often you should exercise. ?Warm up and stretch before being active. ?Do slow stretching after being active (cool down). ?Rest between times of being active. ?Lifestyle ?Work with your doctor and a food expert (dietitian) to set a weight-loss goal that is best for you. ?Limit your screen time. ?Find ways to reward yourself that do not involve food. ?Do not drink alcohol if: ?Your doctor tells you not to drink. ?You are pregnant, may be pregnant, or are planning to become pregnant. ?If you drink alcohol: ?Limit how much you have to: ?0-1 drink a day for women. ?0-2 drinks   a day for men. ?Know how much alcohol is in your drink. In the [place] one drink equals one 12 oz bottle of beer (355 mL), one 5 oz glass of wine (148 mL), or one 1? oz glass of hard liquor (44 mL). ?General instructions ?Keep a weight-loss journal. This can help you keep track of: ?The food that you eat. ?How much exercise you  get. ?Take over-the-counter and prescription medicines only as told by your doctor. ?Take vitamins and supplements only as told by your doctor. ?Think about joining a support group. ?Pay attention to your mental health as obesity can lead to depression or self esteem issues. ?Keep all follow-up visits. ?Contact a doctor if: ?You cannot meet your weight-loss goal after you have changed your diet and lifestyle for 6 weeks. ?You are having trouble breathing. ?Summary ?Obesity is having too much body fat. ?Being obese means that your weight is more than what is healthy for you. ?Work with your doctor to set a weight-loss goal. ?Get regular exercise as told by your doctor. ?This information is not intended to replace advice given to you by your health care provider. Make sure you discuss any questions you have with your health care provider. ?Document Revised: 07/18/2021 Document Reviewed: 07/18/2021 ?Elsevier Patient Education ? [place]!  ?

## 2022-02-22 NOTE — Progress Notes (Signed)
?Industrial/product designer as a Education administrator for [employer], FNP.,have documented all relevant documentation on the behalf of [name], FNP,as directed by  [name], FNP while in the presence of [name], [place]. ? ?This visit occurred during the SARS-CoV-2 public health emergency.  Safety protocols were in place, including screening questions prior to the visit, additional usage of staff PPE, and extensive cleaning of exam room while observing appropriate contact time as indicated for disinfecting solutions. ? ?Subjective:  ?  ? Patient ID: Lauren Carr , female    DOB: 12-10-1976 , 46 y.o.   MRN: [id number] ? ? ?Chief Complaint  ?Patient presents with  ? Obesity  ? ? ?HPI ? ?Patient is here for evaluation for weight loss medication. She states that she is going to the gym 3-4 days a week - exercise with weight training with cardio 30 minutes to 45 minutes. Alternating between elliptical and treadmill. She is using free weights 10 lbs. She has kettle bells. She does squats and modified jumping jacks. She is walking 2.7-3 with a 3-4 incline.  ? ?Diet - one cheat meal, chicken and fish with air fryer. She has been eating more carbs. She eats a good amount of protein. She is eating past full, broccoli, spinach, greens, she eats salad. Eliminated eating out.  She has tried intermittent fasting with 16:8.  She is drinking 4 16 oz bottles of water. She has just got her CPAP machine - she feels it has helped her significantly, by night 2.   ? ?Wt Readings from Last 3 Encounters: ?02/22/22 : 240 lb (108.9 kg) ?01/18/22 : 233 lb (105.7 kg) ?01/04/22 : 233 lb (105.7 kg) ? ? ? ?  ? ?Past Medical History:  ?Diagnosis Date  ? Diabetes mellitus without complication ([place])   ? GERD (gastroesophageal reflux disease)   ? Heart murmur   ? Hyperlipidemia   ? Hypertension   ? NSTEMI (non-ST elevated myocardial infarction) ([place])   ? Obstructive sleep apnea   ? Oxygen deficiency   ? PONV (postoperative nausea and vomiting)   ?  Sleep apnea   ?  ? ?Family History  ?Problem Relation Age of Onset  ? Colon polyps Mother   ? Breast cancer Mother   ?     early 28s  ? Hypertension Mother   ? Long QT syndrome Mother   ?     Diagnosed in 1990s after syncope. Has ICD placed  ? Cancer - Prostate Father   ? Hypertension Father   ? Cancer - Cervical Maternal Grandmother   ? Diabetes Maternal Grandmother   ? CAD Maternal Grandfather   ? CAD Paternal Grandfather   ? Colon cancer Neg Hx   ? Esophageal cancer Neg Hx   ? Rectal cancer Neg Hx   ? Stomach cancer Neg Hx   ? ? ? ?Current Outpatient Medications:  ?  amLODipine (NORVASC) 5 MG tablet, TAKE 1 TABLET BY MOUTH EVERY DAY, Disp: 90 tablet, Rfl: 1 ?  ASPIRIN LOW DOSE 81 MG EC tablet, TAKE 1 TABLET BY MOUTH EVERY DAY, Disp: 30 tablet, Rfl: 6 ?  atorvastatin (LIPITOR) 80 MG tablet, TAKE 1 TABLET BY MOUTH DAILY AT 6 PM., Disp: 90 tablet, Rfl: 1 ?  cholecalciferol (VITAMIN D3) 25 MCG (1000 UT) tablet, Take 3,000 Units by mouth daily., Disp: , Rfl:  ?  Dexlansoprazole 30 MG capsule DR, TAKE 1 CAPSULE BY MOUTH EVERY DAY (NOT COVERED), Disp: 30 capsule, Rfl: 1 ?  hydrochlorothiazide (HYDRODIURIL) 12.5  MG tablet, TAKE 1 TABLET BY MOUTH EVERY DAY, Disp: 30 tablet, Rfl: 2 ?  metFORMIN (GLUCOPHAGE) 500 MG tablet, Take 1 tablet (500 mg total) by mouth 2 (two) times daily with a meal., Disp: 180 tablet, Rfl: 1 ?  metoprolol tartrate (LOPRESSOR) 25 MG tablet, TAKE HALF A PILL BY MOUTH 2 TIMES DAILY., Disp: 30 tablet, Rfl: 17 ?  Multiple Vitamin (MULTIVITAMIN) tablet, Take 1 tablet by mouth daily., Disp: , Rfl:  ?  nitroGLYCERIN (NITROSTAT) 0.4 MG SL tablet, PLACE 1 TABLET UNDER THE TONGUE EVERY 5 MINUTES X 3 DOSES AS NEEDED FOR CHEST PAIN., Disp: 25 tablet, Rfl: 2  ? ?No Known Allergies  ? ?Review of Systems  ?Constitutional: Negative.   ?Respiratory: Negative.    ?Cardiovascular: Negative.   ?Gastrointestinal: Negative.   ?Neurological: Negative.   ?Psychiatric/Behavioral: Negative.     ? ?Today's Vitals  ? 02/22/22  1139  ?BP: 132/74  ?Pulse: 80  ?Temp: 98.3 ?F (36.8 ?C)  ?TempSrc: Oral  ?Weight: 240 lb (108.9 kg)  ?Height: 5\' 6"  (1.676 m)  ? ?Body mass index is 38.74 kg/m?.  ?Wt Readings from Last 3 Encounters:  ?02/22/22 240 lb (108.9 kg)  ?01/18/22 233 lb (105.7 kg)  ?01/04/22 233 lb (105.7 kg)  ? ? ?Objective:  ?Physical Exam ?Vitals reviewed.  ?Constitutional:   ?   General: She is not in acute distress. ?   Appearance: Normal appearance. She is obese.  ?Pulmonary:  ?   Effort: Pulmonary effort is normal. No respiratory distress.  ?Neurological:  ?   General: No focal deficit present.  ?   Mental Status: She is alert and oriented to person, place, and time.  ?   Cranial Nerves: No cranial nerve deficit.  ?  ? ?   ?Assessment And Plan:  ?   ?1. Class 2 severe obesity due to excess calories with serious comorbidity and body mass index (BMI) of 38.0 to 38.9 in adult [facility]) ?Discussed in detail options to increase her physical activity. Unfortunately her insurance does not cover weight loss medications She is encouraged to strive for BMI less than 30 to decrease cardiac risk. Advised to aim for at least 150 minutes of exercise per week. ? ? ? ?Patient was given opportunity to ask questions. Patient verbalized understanding of the plan and was able to repeat key elements of the plan. All questions were answered to their satisfaction.  ?[name], FNP  ? ?I, [name], FNP, have reviewed all documentation for this visit. The documentation on 02/22/22 for the exam, diagnosis, procedures, and orders are all accurate and complete.  ? ?IF YOU HAVE BEEN REFERRED TO A SPECIALIST, IT MAY TAKE 1-2 WEEKS TO SCHEDULE/PROCESS THE REFERRAL. IF YOU HAVE NOT HEARD FROM US/SPECIALIST IN TWO WEEKS, PLEASE GIVE [place] A CALL AT [phone].  ? ?THE PATIENT IS ENCOURAGED TO PRACTICE SOCIAL DISTANCING DUE TO THE COVID-19 PANDEMIC.   ?

## 2022-02-27 ENCOUNTER — Encounter: Payer: Self-pay | Admitting: Nurse Practitioner

## 2022-03-21 ENCOUNTER — Other Ambulatory Visit: Payer: Self-pay | Admitting: Nurse Practitioner

## 2022-03-21 DIAGNOSIS — K219 Gastro-esophageal reflux disease without esophagitis: Secondary | ICD-10-CM

## 2022-03-29 ENCOUNTER — Encounter: Payer: Self-pay | Admitting: Nurse Practitioner

## 2022-03-29 ENCOUNTER — Other Ambulatory Visit: Payer: Self-pay

## 2022-03-29 ENCOUNTER — Other Ambulatory Visit: Payer: Self-pay | Admitting: Nurse Practitioner

## 2022-03-29 MED ORDER — WEGOVY 0.25 MG/0.5ML ~~LOC~~ SOAJ: 0.2500 mg | SUBCUTANEOUS | 0 refills | Status: DC

## 2022-03-31 ENCOUNTER — Other Ambulatory Visit: Payer: Self-pay | Admitting: Nurse Practitioner

## 2022-04-07 ENCOUNTER — Other Ambulatory Visit: Payer: Self-pay | Admitting: Cardiology

## 2022-04-07 DIAGNOSIS — R079 Chest pain, unspecified: Secondary | ICD-10-CM

## 2022-04-25 ENCOUNTER — Other Ambulatory Visit: Payer: Self-pay | Admitting: Nurse Practitioner

## 2022-04-25 DIAGNOSIS — K219 Gastro-esophageal reflux disease without esophagitis: Secondary | ICD-10-CM

## 2022-04-26 ENCOUNTER — Encounter: Payer: Self-pay | Admitting: Nurse Practitioner

## 2022-04-27 ENCOUNTER — Other Ambulatory Visit: Payer: Self-pay

## 2022-04-27 MED ORDER — WEGOVY 0.5 MG/0.5ML ~~LOC~~ SOAJ: 0.5000 mg | SUBCUTANEOUS | 0 refills | Status: DC

## 2022-04-29 ENCOUNTER — Other Ambulatory Visit: Payer: Self-pay | Admitting: Cardiology

## 2022-04-29 DIAGNOSIS — E782 Mixed hyperlipidemia: Secondary | ICD-10-CM

## 2022-04-29 DIAGNOSIS — I252 Old myocardial infarction: Secondary | ICD-10-CM

## 2022-05-07 ENCOUNTER — Other Ambulatory Visit: Payer: Self-pay | Admitting: Nurse Practitioner

## 2022-05-15 ENCOUNTER — Ambulatory Visit: Payer: 59 | Admitting: Nurse Practitioner

## 2022-05-15 ENCOUNTER — Encounter: Payer: Self-pay | Admitting: Nurse Practitioner

## 2022-05-15 ENCOUNTER — Other Ambulatory Visit (HOSPITAL_COMMUNITY): Payer: Self-pay

## 2022-05-15 VITALS — BP 108/80 | HR 89 | Temp 98.3°F | Ht 66.0 in | Wt 235.2 lb

## 2022-05-15 DIAGNOSIS — E66812 Obesity, class 2: Secondary | ICD-10-CM

## 2022-05-15 DIAGNOSIS — I10 Essential (primary) hypertension: Secondary | ICD-10-CM | POA: Diagnosis not present

## 2022-05-15 DIAGNOSIS — Z6837 Body mass index (BMI) 37.0-37.9, adult: Secondary | ICD-10-CM

## 2022-05-15 DIAGNOSIS — R7309 Other abnormal glucose: Secondary | ICD-10-CM

## 2022-05-15 DIAGNOSIS — E7800 Pure hypercholesterolemia, unspecified: Secondary | ICD-10-CM | POA: Diagnosis not present

## 2022-05-15 DIAGNOSIS — E042 Nontoxic multinodular goiter: Secondary | ICD-10-CM

## 2022-05-15 DIAGNOSIS — E8881 Metabolic syndrome: Secondary | ICD-10-CM

## 2022-05-15 DIAGNOSIS — E88819 Insulin resistance, unspecified: Secondary | ICD-10-CM

## 2022-05-15 MED ORDER — WEGOVY 0.5 MG/0.5ML ~~LOC~~ SOAJ
0.5000 mg | SUBCUTANEOUS | 0 refills | Status: DC
  Filled 2022-05-15 – 2022-05-24 (×2): qty 2, 28d supply, fill #0

## 2022-05-15 NOTE — Progress Notes (Signed)
I,Victoria T Hamilton,acting as a Education administrator for [name], FNP.,have documented all relevant documentation on the behalf of [name], FNP,as directed by  [name], FNP while in the presence of [name], [place].   This visit occurred during the SARS-CoV-2 public health emergency.  Safety protocols were in place, including screening questions prior to the visit, additional usage of staff PPE, and extensive cleaning of exam room while observing appropriate contact time as indicated for disinfecting solutions.  Subjective:     Patient ID: Lauren Carr , female    DOB: 12/06/1976 , 46 y.o.   MRN: [id number]   Chief Complaint  Patient presents with   Hypertension    HPI  Pt here today for bpc. She reports being off of wegovy for 3 weks, her pharmacy does not have it in stock.  Denies headache , SOB, chest pain, blurred vision. She reports compliance with medications.   Wt Readings from Last 3 Encounters: 05/15/22 : 235 lb 3.2 oz (106.7 kg) 02/22/22 : 240 lb (108.9 kg) 01/18/22 : 233 lb (105.7 kg)  Exercising 3-4 times a week. She can tell her appetite has decreased.  She is drinking 4-5 bottles of water a day.    Hypertension This is a chronic problem. The current episode started more than 1 year ago. The problem is controlled. Pertinent negatives include no anxiety. There are no associated agents to hypertension. Risk factors for coronary artery disease include obesity and sedentary lifestyle. Past treatments include calcium channel blockers and beta blockers. There are no compliance problems.  There is no history of angina. There is no history of chronic renal disease.  Gastroesophageal Reflux She complains of heartburn. She reports no abdominal pain, no choking or no coughing. Risk factors include obesity. She has tried a diet change for the symptoms.    Past Medical History:  Diagnosis Date   Diabetes mellitus without complication (HCC)    GERD (gastroesophageal reflux  disease)    Heart murmur    Hyperlipidemia    Hypertension    NSTEMI (non-ST elevated myocardial infarction) ([facility])    Obstructive sleep apnea    Oxygen deficiency    PONV (postoperative nausea and vomiting)    Sleep apnea      Family History  Problem Relation Age of Onset   Colon polyps Mother    Breast cancer Mother        early 6s   Hypertension Mother    Long QT syndrome Mother        Diagnosed in 72s after syncope. Has ICD placed   Cancer - Prostate Father    Hypertension Father    Cancer - Cervical Maternal Grandmother    Diabetes Maternal Grandmother    CAD Maternal Grandfather    CAD Paternal Grandfather    Colon cancer Neg Hx    Esophageal cancer Neg Hx    Rectal cancer Neg Hx    Stomach cancer Neg Hx      Current Outpatient Medications:    amLODipine (NORVASC) 5 MG tablet, TAKE 1 TABLET BY MOUTH EVERY DAY, Disp: 30 tablet, Rfl: 14   ASPIRIN LOW DOSE 81 MG EC tablet, TAKE 1 TABLET BY MOUTH EVERY DAY, Disp: 30 tablet, Rfl: 6   atorvastatin (LIPITOR) 80 MG tablet, TAKE 1 TABLET BY MOUTH DAILY AT 6 PM., Disp: 30 tablet, Rfl: 11   cholecalciferol (VITAMIN D3) 25 MCG (1000 UT) tablet, Take 3,000 Units by mouth daily., Disp: , Rfl:    hydrochlorothiazide (HYDRODIURIL)  12.5 MG tablet, TAKE 1 TABLET BY MOUTH EVERY DAY, Disp: 30 tablet, Rfl: 2   metFORMIN (GLUCOPHAGE) 500 MG tablet, Take 1 tablet (500 mg total) by mouth 2 (two) times daily with a meal., Disp: 180 tablet, Rfl: 1   metoprolol tartrate (LOPRESSOR) 25 MG tablet, TAKE HALF A PILL BY MOUTH 2 TIMES DAILY., Disp: 30 tablet, Rfl: 17   Multiple Vitamin (MULTIVITAMIN) tablet, Take 1 tablet by mouth daily., Disp: , Rfl:    nitroGLYCERIN (NITROSTAT) 0.4 MG SL tablet, PLACE 1 TABLET UNDER THE TONGUE EVERY 5 MINUTES X 3 DOSES AS NEEDED FOR CHEST PAIN., Disp: 25 tablet, Rfl: 2   Semaglutide-Weight Management (WEGOVY) 0.5 MG/0.5ML SOAJ, Inject 0.5 mg into the skin once a week., Disp: 2 mL, Rfl: 0   Dexlansoprazole 30 MG  capsule DR, TAKE 1 CAPSULE BY MOUTH EVERY DAY (NOT COVERED), Disp: 30 capsule, Rfl: 1   Semaglutide-Weight Management (WEGOVY) 0.25 MG/0.5ML SOAJ, Inject 0.25 mg into the skin once a week., Disp: 2 mL, Rfl: 0   No Known Allergies   Review of Systems  Constitutional: Negative.   Respiratory: Negative.  Negative for cough and choking.   Cardiovascular: Negative.   Gastrointestinal:  Positive for heartburn. Negative for abdominal pain.  Neurological: Negative.   Psychiatric/Behavioral: Negative.      Today's Vitals   05/15/22 1132  BP: 108/80  Pulse: 89  Temp: 98.3 F (36.8 C)  SpO2: 98%  Weight: 235 lb 3.2 oz (106.7 kg)  Height: $Remove'5\' 6"'dVUEUnh$  (1.676 m)  PainSc: 0-No pain   Body mass index is 37.96 kg/m.  Wt Readings from Last 3 Encounters:  05/15/22 235 lb 3.2 oz (106.7 kg)  02/22/22 240 lb (108.9 kg)  01/18/22 233 lb (105.7 kg)    Objective:  Physical Exam Vitals reviewed.  Constitutional:      General: She is not in acute distress.    Appearance: Normal appearance. She is obese.  Pulmonary:     Effort: Pulmonary effort is normal. No respiratory distress.  Neurological:     General: No focal deficit present.     Mental Status: She is alert and oriented to person, place, and time.     Cranial Nerves: No cranial nerve deficit.        Assessment And Plan:     1. Essential hypertension Comments: Blood pressure is well controlled, continue current medications and follow up wiht Cardiology - Hemoglobin A1c - BMP8+eGFR  2. Abnormal glucose Comments: No current medications, continue wiht healthy diet and regular exercise - Hemoglobin A1c  3. Elevated cholesterol Comments: Continue statin, tolerating well. Encouraged to eat a low fat diet - Hemoglobin A1c  4. Insulin resistance - Hemoglobin A1c  5. Multiple thyroid nodules  6. Class 2 severe obesity with serious comorbidity and body mass index (BMI) of 37.0 to 37.9 in adult, unspecified obesity type ([place])  She is  encouraged to strive for BMI less than 30 to decrease cardiac risk. Advised to aim for at least 150 minutes of exercise per week.  Congratulated on her 5 lb weight loss.   Patient was given opportunity to ask questions. Patient verbalized understanding of the plan and was able to repeat key elements of the plan. All questions were answered to their satisfaction.  [name], FNP   I, [name], FNP, have reviewed all documentation for this visit. The documentation on 05/15/22 for the exam, diagnosis, procedures, and orders are all accurate and complete.   IF YOU HAVE BEEN REFERRED  TO A SPECIALIST, IT MAY TAKE 1-2 WEEKS TO SCHEDULE/PROCESS THE REFERRAL. IF YOU HAVE NOT HEARD FROM US/SPECIALIST IN TWO WEEKS, PLEASE GIVE [place] A CALL AT [phone].   THE PATIENT IS ENCOURAGED TO PRACTICE SOCIAL DISTANCING DUE TO THE COVID-19 PANDEMIC.

## 2022-05-15 NOTE — Patient Instructions (Addendum)
Hypertension, Adult Hypertension is another name for high blood pressure. High blood pressure forces your heart to work harder to pump blood. This can cause problems over time. There are two numbers in a blood pressure reading. There is a top number (systolic) over a bottom number (diastolic). It is best to have a blood pressure that is below 120/80. What are the causes? The cause of this condition is not known. Some other conditions can lead to high blood pressure. What increases the risk? Some lifestyle factors can make you more likely to develop high blood pressure: Smoking. Not getting enough exercise or physical activity. Being overweight. Having too much fat, sugar, calories, or salt (sodium) in your diet. Drinking too much alcohol. Other risk factors include: Having any of these conditions: Heart disease. Diabetes. High cholesterol. Kidney disease. Obstructive sleep apnea. Having a family history of high blood pressure and high cholesterol. Age. The risk increases with age. Stress. What are the signs or symptoms? High blood pressure may not cause symptoms. Very high blood pressure (hypertensive crisis) may cause: Headache. Fast or uneven heartbeats (palpitations). Shortness of breath. Nosebleed. Vomiting or feeling like you may vomit (nauseous). Changes in how you see. Very bad chest pain. Feeling dizzy. Seizures. How is this treated? This condition is treated by making healthy lifestyle changes, such as: Eating healthy foods. Exercising more. Drinking less alcohol. Your doctor may prescribe medicine if lifestyle changes do not help enough and if: Your top number is above 130. Your bottom number is above 80. Your personal target blood pressure may vary. Follow these instructions at home: Eating and drinking  If told, follow the DASH eating plan. To follow this plan: Fill one half of your plate at each meal with fruits and vegetables. Fill one fourth of your plate  at each meal with whole grains. Whole grains include whole-wheat pasta, brown rice, and whole-grain bread. Eat or drink low-fat dairy products, such as skim milk or low-fat yogurt. Fill one fourth of your plate at each meal with low-fat (lean) proteins. Low-fat proteins include fish, chicken without skin, eggs, beans, and tofu. Avoid fatty meat, cured and processed meat, or chicken with skin. Avoid pre-made or processed food. Limit the amount of salt in your diet to less than 1,500 mg each day. Do not drink alcohol if: Your doctor tells you not to drink. You are pregnant, may be pregnant, or are planning to become pregnant. If you drink alcohol: Limit how much you have to: 0-1 drink a day for women. 0-2 drinks a day for men. Know how much alcohol is in your drink. In the [place] one drink equals one 12 oz bottle of beer (355 mL), one 5 oz glass of wine (148 mL), or one 1 oz glass of hard liquor (44 mL). Lifestyle  Work with your doctor to stay at a healthy weight or to lose weight. Ask your doctor what the best weight is for you. Get at least 30 minutes of exercise that causes your heart to beat faster (aerobic exercise) most days of the week. This may include walking, swimming, or biking. Get at least 30 minutes of exercise that strengthens your muscles (resistance exercise) at least 3 days a week. This may include lifting weights or doing Pilates. Do not smoke or use any products that contain nicotine or tobacco. If you need help quitting, ask your doctor. Check your blood pressure at home as told by your doctor. Keep all follow-up visits. Medicines Take over-the-counter and prescription medicines  only as told by your doctor. Follow directions carefully. Do not skip doses of blood pressure medicine. The medicine does not work as well if you skip doses. Skipping doses also puts you at risk for problems. Ask your doctor about side effects or reactions to medicines that you should watch  for. Contact a doctor if: You think you are having a reaction to the medicine you are taking. You have headaches that keep coming back. You feel dizzy. You have swelling in your ankles. You have trouble with your vision. Get help right away if: You get a very bad headache. You start to feel mixed up (confused). You feel weak or numb. You feel faint. You have very bad pain in your: Chest. Belly (abdomen). You vomit more than once. You have trouble breathing. These symptoms may be an emergency. Get help right away. Call 911. Do not wait to see if the symptoms will go away. Do not drive yourself to the hospital. Summary Hypertension is another name for high blood pressure. High blood pressure forces your heart to work harder to pump blood. For most people, a normal blood pressure is less than 120/80. Making healthy choices can help lower blood pressure. If your blood pressure does not get lower with healthy choices, you may need to take medicine. This information is not intended to replace advice given to you by your health care provider. Make sure you discuss any questions you have with your health care provider. Document Revised: 09/28/2021 Document Reviewed: 09/28/2021 Elsevier Patient Education  [facility] Address: [address], [place], [place] [address] Hours:  Open ? Closes 6?PM Phone: [phone]

## 2022-05-16 LAB — BMP8+EGFR
BUN/Creatinine Ratio: 12 (ref 9–23)
BUN: 11 mg/dL (ref 6–24)
CO2: 22 mmol/L (ref 20–29)
Calcium: 9.9 mg/dL (ref 8.7–10.2)
Chloride: 100 mmol/L (ref 96–106)
Creatinine, Ser: 0.93 mg/dL (ref 0.57–1.00)
Glucose: 94 mg/dL (ref 70–99)
Potassium: 4.2 mmol/L (ref 3.5–5.2)
Sodium: 140 mmol/L (ref 134–144)
eGFR: 76 mL/min/{1.73_m2} (ref >59)

## 2022-05-16 LAB — HEMOGLOBIN A1C
Est. average glucose Bld gHb Est-mCnc: 131 mg/dL
Hgb A1c MFr Bld: 6.2 % — ABNORMAL HIGH (ref 4.8–5.6)

## 2022-05-24 ENCOUNTER — Other Ambulatory Visit: Payer: Self-pay

## 2022-05-24 ENCOUNTER — Other Ambulatory Visit (HOSPITAL_COMMUNITY): Payer: Self-pay

## 2022-05-24 MED ORDER — WEGOVY 0.25 MG/0.5ML ~~LOC~~ SOAJ: 0.2500 mg | SUBCUTANEOUS | 0 refills | Status: DC

## 2022-05-27 ENCOUNTER — Encounter: Payer: Self-pay | Admitting: Nurse Practitioner

## 2022-05-29 ENCOUNTER — Other Ambulatory Visit: Payer: Self-pay | Admitting: Nurse Practitioner

## 2022-05-29 DIAGNOSIS — K219 Gastro-esophageal reflux disease without esophagitis: Secondary | ICD-10-CM

## 2022-06-06 ENCOUNTER — Encounter (HOSPITAL_COMMUNITY): Payer: Self-pay

## 2022-06-06 ENCOUNTER — Observation Stay (HOSPITAL_COMMUNITY)
Admission: EM | Admit: 2022-06-06 | Discharge: 2022-06-07 | Disposition: A | Discharge status: Home / Self Care | Payer: 59 | Attending: Internal Medicine | Admitting: Internal Medicine

## 2022-06-06 ENCOUNTER — Emergency Department (HOSPITAL_COMMUNITY): Payer: 59

## 2022-06-06 ENCOUNTER — Other Ambulatory Visit: Payer: Self-pay

## 2022-06-06 DIAGNOSIS — E782 Mixed hyperlipidemia: Secondary | ICD-10-CM | POA: Insufficient documentation

## 2022-06-06 DIAGNOSIS — R9431 Abnormal electrocardiogram [ECG] [EKG]: Secondary | ICD-10-CM | POA: Diagnosis not present

## 2022-06-06 DIAGNOSIS — E876 Hypokalemia: Secondary | ICD-10-CM | POA: Insufficient documentation

## 2022-06-06 DIAGNOSIS — I10 Essential (primary) hypertension: Secondary | ICD-10-CM | POA: Diagnosis not present

## 2022-06-06 DIAGNOSIS — Z7984 Long term (current) use of oral hypoglycemic drugs: Secondary | ICD-10-CM | POA: Diagnosis not present

## 2022-06-06 DIAGNOSIS — E1169 Type 2 diabetes mellitus with other specified complication: Secondary | ICD-10-CM | POA: Diagnosis not present

## 2022-06-06 DIAGNOSIS — R072 Precordial pain: Secondary | ICD-10-CM | POA: Diagnosis present

## 2022-06-06 DIAGNOSIS — R079 Chest pain, unspecified: Secondary | ICD-10-CM

## 2022-06-06 DIAGNOSIS — Z79899 Other long term (current) drug therapy: Secondary | ICD-10-CM | POA: Diagnosis not present

## 2022-06-06 DIAGNOSIS — E669 Obesity, unspecified: Secondary | ICD-10-CM

## 2022-06-06 DIAGNOSIS — Z7982 Long term (current) use of aspirin: Secondary | ICD-10-CM | POA: Insufficient documentation

## 2022-06-06 DIAGNOSIS — Z7985 Long-term (current) use of injectable non-insulin antidiabetic drugs: Secondary | ICD-10-CM | POA: Diagnosis not present

## 2022-06-06 DIAGNOSIS — G4733 Obstructive sleep apnea (adult) (pediatric): Secondary | ICD-10-CM | POA: Diagnosis present

## 2022-06-06 LAB — HEPATIC FUNCTION PANEL
ALT: 30 U/L (ref 0–44)
AST: 23 U/L (ref 15–41)
Albumin: 4.5 g/dL (ref 3.5–5.0)
Alkaline Phosphatase: 66 U/L (ref 38–126)
Bilirubin, Direct: <0.1 mg/dL (ref 0.0–0.2)
Total Bilirubin: 0.9 mg/dL (ref 0.3–1.2)
Total Protein: 8.4 g/dL — ABNORMAL HIGH (ref 6.5–8.1)

## 2022-06-06 LAB — BASIC METABOLIC PANEL
Anion gap: 11 (ref 5–15)
BUN: 9 mg/dL (ref 6–20)
CO2: 24 mmol/L (ref 22–32)
Calcium: 9.6 mg/dL (ref 8.9–10.3)
Chloride: 103 mmol/L (ref 98–111)
Creatinine, Ser: 0.91 mg/dL (ref 0.44–1.00)
GFR, Estimated: >60 mL/min (ref >60)
Glucose, Bld: 155 mg/dL — ABNORMAL HIGH (ref 70–99)
Potassium: 2.8 mmol/L — ABNORMAL LOW (ref 3.5–5.1)
Sodium: 138 mmol/L (ref 135–145)

## 2022-06-06 LAB — CBC
HCT: 37.4 % (ref 36.0–46.0)
Hemoglobin: 12.1 g/dL (ref 12.0–15.0)
MCH: 26.7 pg (ref 26.0–34.0)
MCHC: 32.4 g/dL (ref 30.0–36.0)
MCV: 82.6 fL (ref 80.0–100.0)
Platelets: 251 10*3/uL (ref 150–400)
RBC: 4.53 MIL/uL (ref 3.87–5.11)
RDW: 15.2 % (ref 11.5–15.5)
WBC: 6.5 10*3/uL (ref 4.0–10.5)
nRBC: 0 % (ref 0.0–0.2)

## 2022-06-06 LAB — TROPONIN I (HIGH SENSITIVITY)
Troponin I (High Sensitivity): 4 ng/L (ref <18)
Troponin I (High Sensitivity): 5 ng/L (ref <18)
Troponin I (High Sensitivity): 5 ng/L (ref <18)

## 2022-06-06 LAB — I-STAT BETA HCG BLOOD, ED (MC, WL, AP ONLY): I-stat hCG, quantitative: <5 m[IU]/mL (ref <5)

## 2022-06-06 LAB — LIPASE, BLOOD: Lipase: 30 U/L (ref 11–51)

## 2022-06-06 MED ORDER — PANTOPRAZOLE SODIUM 40 MG PO TBEC: 40.0000 mg | DELAYED_RELEASE_TABLET | Freq: Every day | ORAL | Status: DC

## 2022-06-06 MED ORDER — ACETAMINOPHEN 325 MG PO TABS: 650.0000 mg | ORAL_TABLET | ORAL | Status: DC | PRN

## 2022-06-06 MED ORDER — HYDROCHLOROTHIAZIDE 12.5 MG PO TABS: 12.5000 mg | ORAL_TABLET | Freq: Every day | ORAL | Status: DC

## 2022-06-06 MED ORDER — ATORVASTATIN CALCIUM 80 MG PO TABS: 80.0000 mg | ORAL_TABLET | Freq: Every day | ORAL | Status: DC

## 2022-06-06 MED ORDER — IOHEXOL 350 MG/ML SOLN
80.0000 mL | Freq: Once | INTRAVENOUS | Status: AC | PRN
  Administered 2022-06-06: 80 mL via INTRAVENOUS

## 2022-06-06 MED ORDER — METOPROLOL TARTRATE 25 MG PO TABS: 25.0000 mg | ORAL_TABLET | Freq: Two times a day (BID) | ORAL | Status: DC

## 2022-06-06 MED ORDER — SODIUM CHLORIDE 0.9 % IV BOLUS
500.0000 mL | Freq: Once | INTRAVENOUS | Status: AC
  Administered 2022-06-06: 500 mL via INTRAVENOUS

## 2022-06-06 MED ORDER — HEPARIN SODIUM (PORCINE) 5000 UNIT/ML IJ SOLN
5000.0000 [IU] | Freq: Three times a day (TID) | INTRAMUSCULAR | Status: DC
  Administered 2022-06-07 (×2): 5000 [IU] via SUBCUTANEOUS
  Filled 2022-06-06 (×2): qty 1

## 2022-06-06 MED ORDER — AMLODIPINE BESYLATE 5 MG PO TABS: 5.0000 mg | ORAL_TABLET | Freq: Every day | ORAL | Status: DC

## 2022-06-06 MED ORDER — POTASSIUM CHLORIDE CRYS ER 20 MEQ PO TBCR
40.0000 meq | EXTENDED_RELEASE_TABLET | Freq: Once | ORAL | Status: AC
Start: 2022-06-06 — End: 2022-06-06
  Administered 2022-06-06: 40 meq via ORAL
  Filled 2022-06-06: qty 2

## 2022-06-06 MED ORDER — ONDANSETRON HCL 4 MG/2ML IJ SOLN: 4.0000 mg | Freq: Four times a day (QID) | INTRAMUSCULAR | Status: DC | PRN

## 2022-06-06 MED ORDER — SODIUM CHLORIDE 0.9 % IV SOLN: INTRAVENOUS | Status: AC

## 2022-06-06 MED ORDER — POTASSIUM CHLORIDE CRYS ER 20 MEQ PO TBCR
40.0000 meq | EXTENDED_RELEASE_TABLET | Freq: Once | ORAL | Status: AC
  Administered 2022-06-07: 40 meq via ORAL
  Filled 2022-06-06: qty 2

## 2022-06-06 NOTE — ED Provider Notes (Signed)
Shared visit.  Patient here with chest pain.  She states somewhat on and off chest pain for a long time but she is having worsening pain this morning.  She did recently have a trip to [place].  No recent surgeries or other PE or DVT risk factors.  Denies any infectious symptoms.  Chest pain is overall nonspecific but she has multiple cardiac risk factors.  She does have high cholesterol, hypertension, diabetes.  History of NSTEMI.  Per chart review she did have a cardiac cath about 3 years ago that showed fairly clean coronaries.  EKG today shows sinus rhythm.  She does have some T wave inversions that appear new from prior in the lateral leads V4 through V6.  Troponins thus far are both unremarkable.  Chest x-ray per my review and interpretation shows no pneumonia.  No significant anemia, electrolyte abnormality, kidney injury otherwise.  My PA has talked with cardiology who is okay with observation stay with medicine.  Suspect echocardiogram tomorrow and cardiology will see patient in the a.m.  We will get a CT scan to rule out PE and admit to medicine.  This chart was dictated using voice recognition software.  Despite best efforts to proofread,  errors can occur which can change the documentation meaning.    Lennice Sites, DO 06/06/22 1909

## 2022-06-06 NOTE — ED Provider Triage Note (Signed)
Emergency Medicine Provider Triage Evaluation Note  Lauren Carr , a 46 y.o. female  was evaluated in triage.  Pt complains of left-sided chest pain starting this morning.  Patient states she had some chest pains last night, that went away on their own.  When she was driving to work this morning she just was not feeling well.  She had rushed out of the house without eating breakfast, but had multiple cups of coffee.  She had not taken her normal medications including her blood pressure medicine, aspirin, or metformin.  When she got to work she started having sharp chest pains in the left side of her chest and associated shortness of breath and nausea.  Was given Zofran in route.  States chest pain has improved, but overall still feels nauseous and does not feel well.  Review of Systems  Positive: Chest pain, shortness of breath, nausea, vomiting Negative: Lightheadedness, syncope, abdominal pain  Physical Exam  LMP  (LMP Unknown) Comment: pt does not remeber last cycle.  SpO2 97%  Gen:   Awake, no distress   Resp:  Normal effort  MSK:   Moves extremities without difficulty  Other:  Is tearful in triage  Medical Decision Making  Medically screening exam initiated at 11:03 AM.  Appropriate orders placed.  Larey Dresser was informed that the remainder of the evaluation will be completed by another provider, this initial triage assessment does not replace that evaluation, and the importance of remaining in the ED until their evaluation is complete.     Song Myre T, PA-C 06/06/22 1103

## 2022-06-06 NOTE — ED Provider Notes (Signed)
[facility] EMERGENCY DEPARTMENT Provider Note   CSN: [id number] Arrival date & time: 06/06/22  1048    History  Chief Complaint  Patient presents with   Chest Pain    PASSION LAVIN is a 46 y.o. female hx of NSTEMI, HTN, hyperlipidemia here for evaluation of left sided chest pain and dizziness. Began this morning. Worse with exertion. Was running late this morning and left house with only having coffee. Felt nauseous. Worsening CP at work to left chest. Doe snot radiate into back, left arm. No hx of PE, DVT, recent surgery immobilization or malignancy. Had a brief episode of CP last night which self resolved. CP non pleuritic in nature. Does not typically get CP with ADL. Has CP a few months ago was seen by Cards without definitive answer of cause of pain.  [facility], Dr. Lora Havens  NSTEMI 2022, clear cath  HPI     Home Medications Prior to Admission medications   Medication Sig Start Date End Date Taking? Authorizing Provider  amLODipine (NORVASC) 5 MG tablet TAKE 1 TABLET BY MOUTH EVERY DAY Patient taking differently: Take 5 mg by mouth daily. 04/09/22  Yes Patwardhan, Manish J, MD  ASPIRIN LOW DOSE 81 MG EC tablet TAKE 1 TABLET BY MOUTH EVERY DAY Patient taking differently: Take 81 mg by mouth daily. 01/25/22  Yes Patwardhan, Manish J, MD  atorvastatin (LIPITOR) 80 MG tablet TAKE 1 TABLET BY MOUTH DAILY AT 6 PM. Patient taking differently: Take 80 mg by mouth daily. 04/30/22  Yes Patwardhan, Manish J, MD  cholecalciferol (VITAMIN D3) 25 MCG (1000 UT) tablet Take 3,000 Units by mouth daily.   Yes [provider]  Dexlansoprazole 30 MG capsule DR TAKE 1 CAPSULE BY MOUTH EVERY DAY (NOT COVERED) Patient taking differently: Take 30 mg by mouth daily. 05/29/22  Yes [name], FNP  hydrochlorothiazide (HYDRODIURIL) 12.5 MG tablet TAKE 1 TABLET BY MOUTH EVERY DAY Patient taking differently: Take 12.5 mg by mouth daily. 05/07/22  Yes [name],  FNP  metFORMIN (GLUCOPHAGE) 500 MG tablet Take 1 tablet (500 mg total) by mouth 2 (two) times daily with a meal. 11/13/21  Yes [name], FNP  metoprolol tartrate (LOPRESSOR) 25 MG tablet TAKE HALF A PILL BY MOUTH 2 TIMES DAILY. 11/06/21  Yes Patwardhan, Manish J, MD  Multiple Vitamin (MULTIVITAMIN) tablet Take 1 tablet by mouth daily.   Yes [provider]  nitroGLYCERIN (NITROSTAT) 0.4 MG SL tablet PLACE 1 TABLET UNDER THE TONGUE EVERY 5 MINUTES X 3 DOSES AS NEEDED FOR CHEST PAIN. Patient taking differently: Place 0.4 mg under the tongue every 5 (five) minutes as needed for chest pain. 12/19/20   Patwardhan, Reynold Bowen, MD  Semaglutide-Weight Management (WEGOVY) 0.25 MG/0.5ML SOAJ Inject 0.25 mg into the skin once a week. 05/24/22   [name], FNP      Allergies    Patient has no known allergies.    Review of Systems   Review of Systems  Constitutional: Negative.   HENT: Negative.    Respiratory: Negative.    Cardiovascular:  Positive for chest pain. Negative for palpitations and leg swelling.  Gastrointestinal:  Positive for nausea and vomiting. Negative for abdominal distention, abdominal pain, anal bleeding, blood in stool, constipation, diarrhea and rectal pain.  Genitourinary: Negative.   Musculoskeletal: Negative.   Skin: Negative.   Neurological: Negative.   All other systems reviewed and are negative.   Physical Exam Updated Vital Signs BP (!) 99/48   Pulse 76  Temp 98.4 F (36.9 C) (Oral)   Resp 20   Ht '5\' 8"'$  (1.727 m)   Wt 106.6 kg   LMP  (LMP Unknown) Comment: pt does not remeber last cycle.  SpO2 99%   BMI 35.73 kg/m  Physical Exam Vitals and nursing note reviewed.  Constitutional:      General: She is not in acute distress.    Appearance: She is well-developed. She is obese. She is not ill-appearing, toxic-appearing or diaphoretic.  HENT:     Head: Atraumatic.  Eyes:     Pupils: Pupils are equal, round, and reactive to light.   Cardiovascular:     Rate and Rhythm: Normal rate.     Pulses:          Radial pulses are 2+ on the right side and 2+ on the left side.       Dorsalis pedis pulses are 2+ on the right side and 2+ on the left side.     Heart sounds: Normal heart sounds.  Pulmonary:     Effort: Pulmonary effort is normal. No respiratory distress.     Breath sounds: Normal breath sounds.  Chest:     Chest wall: No mass, deformity or tenderness.  Abdominal:     General: Bowel sounds are normal. There is no distension.     Palpations: Abdomen is soft.     Tenderness: There is no abdominal tenderness. There is no guarding or rebound.  Musculoskeletal:        General: Normal range of motion.     Cervical back: Normal range of motion.     Right lower leg: No tenderness. No edema.     Left lower leg: No tenderness. No edema.  Skin:    General: Skin is warm and dry.     Capillary Refill: Capillary refill takes less than 2 seconds.  Neurological:     General: No focal deficit present.     Mental Status: She is alert.  Psychiatric:        Mood and Affect: Mood normal.     ED Results / Procedures / Treatments   Labs (all labs ordered are listed, but only abnormal results are displayed) Labs Reviewed  BASIC METABOLIC PANEL - Abnormal; Notable for the following components:      Result Value   Potassium 2.8 (*)    Glucose, Bld 155 (*)    All other components within normal limits  HEPATIC FUNCTION PANEL - Abnormal; Notable for the following components:   Total Protein 8.4 (*)    All other components within normal limits  CBC  LIPASE, BLOOD  HIV ANTIBODY (ROUTINE TESTING W REFLEX)  MAGNESIUM  I-STAT BETA HCG BLOOD, ED ([facility], [facility], [facility] ONLY)  TROPONIN I (HIGH SENSITIVITY)  TROPONIN I (HIGH SENSITIVITY)  TROPONIN I (HIGH SENSITIVITY)    EKG EKG Interpretation  Date/Time:  Wednesday June 06 2022 11:14:57 EDT Ventricular Rate:  98 PR Interval:  168 QRS Duration: 68 QT Interval:  354 QTC  Calculation: 451 R Axis:   -29 Text Interpretation: Normal sinus rhythm Anterior infarct , age undetermined T wave abnormality, consider inferolateral ischemia Abnormal ECG When compared with ECG of 20-Apr-2019 05:10, PREVIOUS ECG IS PRESENT Confirmed by Lennice Sites [phone]) on 06/06/2022 4:20:33 PM  Radiology CT Angio Chest PE W and/or Wo Contrast  Result Date: 06/06/2022 CLINICAL DATA:  Pulmonary embolism suspected. Intermittent chest pain worsening this morning. No history of PE. Hypertension and diabetes. EXAM: CT ANGIOGRAPHY CHEST WITH CONTRAST  TECHNIQUE: Multidetector CT imaging of the chest was performed using the standard protocol during bolus administration of intravenous contrast. Multiplanar CT image reconstructions and MIPs were obtained to evaluate the vascular anatomy. RADIATION DOSE REDUCTION: This exam was performed according to the departmental dose-optimization program which includes automated exposure control, adjustment of the mA and/or kV according to patient size and/or use of iterative reconstruction technique. CONTRAST:  21m OMNIPAQUE IOHEXOL 350 MG/ML SOLN COMPARISON:  PA Lat chest today, PA Lat 04/19/2019. No prior cross-sectional imaging for comparison. FINDINGS: Cardiovascular: The pulmonary arteries are normal in caliber. The subsegmental arterial bed is largely unopacified and not evaluated due to bolus timing, but no arterial embolism is seen through the segmental divisions. Thoracic aorta and great vessels are normal. Pulmonary veins are decompressed. There is no visible coronary artery calcification no significant pericardial effusion. Mediastinum/Nodes: No enlarged mediastinal, hilar, or axillary lymph nodes. Thyroid gland, trachea, and esophagus demonstrate no significant findings. Lungs/Pleura: There is posterior subpleural atelectasis in the lower lobes. Lungs are clear of infiltrates and nodules. There are few linear scar-like opacities in both bases. Central airways are  clear. There is no pleural effusion, thickening or pneumothorax. Upper Abdomen: No acute abnormality. Musculoskeletal: No chest wall abnormality. No acute or significant osseous findings. Review of the MIP images confirms the above findings. IMPRESSION: There are no acute chest CT or CTA findings. The pulmonary arteries are clear at least through the segmental divisions with the subsegmental arterial bed not well evaluated due to bolus timing. There is no evidence of acute pneumonia. Electronically Signed   By: KTelford NabM.D.   On: 06/06/2022 21:30   DG Chest 2 View  Result Date: 06/06/2022 CLINICAL DATA:  Nausea and left-sided chest pain. Hypertension. Diabetes EXAM: CHEST - 2 VIEW COMPARISON:  04/19/2019 FINDINGS: Cholecystectomy. Reverse apical lordotic positioning. Midline trachea. Normal heart size and mediastinal contours. No pleural effusion or pneumothorax. Clear lungs. IMPRESSION: No active cardiopulmonary disease. Electronically Signed   By: KAbigail MiyamotoM.D.   On: 06/06/2022 11:38    Procedures Procedures    Medications Ordered in ED Medications  acetaminophen (TYLENOL) tablet 650 mg (has no administration in time range)  ondansetron (ZOFRAN) injection 4 mg (has no administration in time range)  heparin injection 5,000 Units (has no administration in time range)  amLODipine (NORVASC) tablet 5 mg (has no administration in time range)  atorvastatin (LIPITOR) tablet 80 mg (has no administration in time range)  pantoprazole (PROTONIX) EC tablet 40 mg (has no administration in time range)  hydrochlorothiazide (HYDRODIURIL) tablet 12.5 mg (has no administration in time range)  potassium chloride SA (KLOR-CON M) CR tablet 40 mEq (has no administration in time range)  metoprolol tartrate (LOPRESSOR) tablet 25 mg (has no administration in time range)  0.9 %  sodium chloride infusion (has no administration in time range)  sodium chloride 0.9 % bolus 500 mL (0 mLs Intravenous Stopped  06/06/22 1849)  potassium chloride SA (KLOR-CON M) CR tablet 40 mEq (40 mEq Oral Given 06/06/22 1856)  iohexol (OMNIPAQUE) 350 MG/ML injection 80 mL (80 mLs Intravenous Contrast Given 06/06/22 2112)    ED Course/  Medical Decision Making/ A&P   46year old history of diabetes, hypertension, hypercholesterolemia, prior NSTEMI here for evaluation of left-sided chest pain.  Began earlier today.  Worse with up and moving.  Associated nausea and lightheadedness.  Pain does not radiate to left arm, left back or jaw. No hx of PE, DVT recent surgery, immobilization, malignancy. No clinical  evidence of VTE on exam.  Patient neurovascularly intact.  Labs and imaging personally viewed and interpreted:  EKG does show some T wave inversions to 2,3,aVF Troponin 4 Pregnancy test negative CBC without leukocytosis Metabolic panel hypokalemia at 2.8 DG chest without infiltrates, cardiomegaly, pulm edema, pneumothorax  Given history, EKG changes will touch base with Cards for recommendations  CONSULT with Dr. Terri Skains with Cardiology who recommends either observation over night with the medicine team vs dc home and close outpatient FU.  Discussed with patient. Would like to wait for delta trop and decide with family  Patient reassessed. Continues to have intermittent 5-10 second episodes of cp. Currently CP free. Sx do not seem consistent with ACS, unstable angina. Will hold on Heparin. Family and patient would like admission for overnight observation. Discussed with radiology who recommends admission for overnight observation under the medicine team and cardiology will see tomorrow. Will get CTA given recent plan trip to r/o PE.  Patient had recurrent chest pain, chest pain-free when I went to reevaluate patient.  We will add on delta troponin.  Will admit for continued observation  Dr. Terri Skains with neurology will see patient in the morning  CONSULT with Dr. Trilby Drummer with [facility] who is agreeable to see patient for  admission  The patient appears reasonably stabilized for admission considering the current resources, flow, and capabilities available in the ED at this time, and I doubt any other [facility] requiring further screening and/or treatment in the ED prior to admission.                              Medical Decision Making Amount and/or Complexity of Data Reviewed Independent Historian: friend External Data Reviewed: labs, radiology, ECG and notes. Labs: ordered. Decision-making details documented in ED Course. Radiology: ordered and independent interpretation performed. Decision-making details documented in ED Course. ECG/medicine tests: ordered and independent interpretation performed. Decision-making details documented in ED Course.  Risk OTC drugs. Prescription drug management. Parenteral controlled substances. Decision regarding hospitalization. Diagnosis or treatment significantly limited by social determinants of health.          Final Clinical Impression(s) / ED Diagnoses Final diagnoses:  Precordial pain    Rx / DC Orders ED Discharge Orders     None         Asha Grumbine A, PA-C 06/06/22 2221    Lennice Sites, DO 06/06/22 2247

## 2022-06-06 NOTE — H&P (Addendum)
History and Physical   Lauren Carr GGY:[id number] DOB: 10/06/1976 DOA: 06/06/2022  PCP: [name], FNP   Patient coming from: Home  Chief Complaint: Chest pain  HPI: Lauren Carr is a 46 y.o. female with medical history significant of insomnia, OSA, fibromyalgia, hypertension, obesity, anxiety, NSTEMI, hyperlipidemia, GERD presenting with chest pain.  Patient has some chronic intermittent chest pain for the past several years.  However it was worse this morning.  She states she had some mild chest pain last night that was more typical and self resolved.  This morning she had significant severe left-sided chest pain with associated nausea and lightheadedness/dizziness.  Pain did not radiate was not clearly associated with exertion or rest.  She said it felt similar to when she had a previous NSTEMI.  During evaluation for that she was noted to have a clean catheterization.  She reports recent trip to [place].  Otherwise she has no prolonged episodes of sitting.  She denies fevers, chills, abdominal pain, constipation, diarrhea, shortness of breath.  ED Course: Vital signs in the ED significant for blood pressure in the 035K to 093G systolic and heart rate in the 90s to 100s.  Lab work-up included CMP with potassium of 2.8, glucose 155, protein 8.4.  CBC within normal limits.  Troponin normal at 4 than 5 with third pending.  Lipase normal.  Patient received 40 mEq of p.o. potassium and 500 cc of IV fluid.  Her cardiologist office, [facility] cardiology was consulted and will see the patient and recommend observation.  Review of Systems: As per HPI otherwise all other systems reviewed and are negative.  Past Medical History:  Diagnosis Date   Diabetes mellitus without complication (HCC)    GERD (gastroesophageal reflux disease)    Heart murmur    Hyperlipidemia    Hypertension    NSTEMI (non-ST elevated myocardial infarction) ([place])    Obstructive sleep apnea    Oxygen deficiency     PONV (postoperative nausea and vomiting)    Sleep apnea     Past Surgical History:  Procedure Laterality Date   CESAREAN SECTION     CHOLECYSTECTOMY     LAPAROSCOPY  1998   LEFT HEART CATH AND CORONARY ANGIOGRAPHY N/A 04/20/2019   Procedure: LEFT HEART CATH AND CORONARY ANGIOGRAPHY;  Surgeon: Nigel Mormon, MD;  Location: [facility];  Service: Cardiovascular;  Laterality: N/A;    Social History  reports that she has never smoked. She has never used smokeless tobacco. She reports that she does not currently use alcohol. She reports that she does not use drugs.  No Known Allergies  Family History  Problem Relation Age of Onset   Colon polyps Mother    Breast cancer Mother        early 2s   Hypertension Mother    Long QT syndrome Mother        Diagnosed in 76s after syncope. Has ICD placed   Cancer - Prostate Father    Hypertension Father    Cancer - Cervical Maternal Grandmother    Diabetes Maternal Grandmother    CAD Maternal Grandfather    CAD Paternal Grandfather    Colon cancer Neg Hx    Esophageal cancer Neg Hx    Rectal cancer Neg Hx    Stomach cancer Neg Hx   Reviewed on admission  Prior to Admission medications   Medication Sig Start Date End Date Taking? Authorizing Provider  amLODipine (NORVASC) 5 MG tablet TAKE 1 TABLET  BY MOUTH EVERY DAY Patient taking differently: Take 5 mg by mouth daily. 04/09/22  Yes Patwardhan, Manish J, MD  ASPIRIN LOW DOSE 81 MG EC tablet TAKE 1 TABLET BY MOUTH EVERY DAY Patient taking differently: Take 81 mg by mouth daily. 01/25/22  Yes Patwardhan, Manish J, MD  atorvastatin (LIPITOR) 80 MG tablet TAKE 1 TABLET BY MOUTH DAILY AT 6 PM. Patient taking differently: Take 80 mg by mouth daily. 04/30/22  Yes Patwardhan, Manish J, MD  cholecalciferol (VITAMIN D3) 25 MCG (1000 UT) tablet Take 3,000 Units by mouth daily.   Yes [provider]  Dexlansoprazole 30 MG capsule DR TAKE 1 CAPSULE BY MOUTH EVERY DAY (NOT  COVERED) Patient taking differently: Take 30 mg by mouth daily. 05/29/22  Yes [name], FNP  hydrochlorothiazide (HYDRODIURIL) 12.5 MG tablet TAKE 1 TABLET BY MOUTH EVERY DAY Patient taking differently: Take 12.5 mg by mouth daily. 05/07/22  Yes [name], FNP  metFORMIN (GLUCOPHAGE) 500 MG tablet Take 1 tablet (500 mg total) by mouth 2 (two) times daily with a meal. 11/13/21  Yes [name], FNP  metoprolol tartrate (LOPRESSOR) 25 MG tablet TAKE HALF A PILL BY MOUTH 2 TIMES DAILY. 11/06/21  Yes Patwardhan, Manish J, MD  Multiple Vitamin (MULTIVITAMIN) tablet Take 1 tablet by mouth daily.   Yes [provider]  nitroGLYCERIN (NITROSTAT) 0.4 MG SL tablet PLACE 1 TABLET UNDER THE TONGUE EVERY 5 MINUTES X 3 DOSES AS NEEDED FOR CHEST PAIN. Patient taking differently: Place 0.4 mg under the tongue every 5 (five) minutes as needed for chest pain. 12/19/20   Patwardhan, Reynold Bowen, MD  Semaglutide-Weight Management (WEGOVY) 0.25 MG/0.5ML SOAJ Inject 0.25 mg into the skin once a week. 05/24/22   [name], FNP  Semaglutide-Weight Management (WEGOVY) 0.5 MG/0.5ML SOAJ Inject 0.5 mg into the skin once a week. Patient not taking: Reported on 06/06/2022 05/15/22   [name], FNP    Physical Exam: Vitals:   06/06/22 1304 06/06/22 1509 06/06/22 1754 06/06/22 2145  BP: 138/81 (!) 152/79 (!) 114/58 (!) 99/48  Pulse: 93 (!) 105 90 76  Resp: '15 20 20 20  '$ Temp:  98.4 F (36.9 C)    TempSrc:  Oral    SpO2: 97% 100% 100% 99%  Weight:      Height:        Physical Exam Constitutional:      General: She is not in acute distress.    Appearance: Normal appearance. She is obese.  HENT:     Head: Normocephalic and atraumatic.     Mouth/Throat:     Mouth: Mucous membranes are moist.     Pharynx: Oropharynx is clear.  Eyes:     Extraocular Movements: Extraocular movements intact.     Pupils: Pupils are equal, round, and reactive to light.  Cardiovascular:     Rate and Rhythm: Normal  rate and regular rhythm.     Pulses: Normal pulses.     Heart sounds: Normal heart sounds.  Pulmonary:     Effort: Pulmonary effort is normal. No respiratory distress.     Breath sounds: Normal breath sounds.  Abdominal:     General: Bowel sounds are normal. There is no distension.     Palpations: Abdomen is soft.     Tenderness: There is no abdominal tenderness.  Musculoskeletal:        General: No swelling or deformity.  Skin:    General: Skin is warm and dry.  Neurological:  General: No focal deficit present.     Mental Status: Mental status is at baseline.    Labs on Admission: I have personally reviewed following labs and imaging studies  CBC: Recent Labs  Lab 06/06/22 1058  WBC 6.5  HGB 12.1  HCT 37.4  MCV 82.6  PLT 784    Basic Metabolic Panel: Recent Labs  Lab 06/06/22 1058  NA 138  K 2.8*  CL 103  CO2 24  GLUCOSE 155*  BUN 9  CREATININE 0.91  CALCIUM 9.6    GFR: Estimated Creatinine Clearance: 96.7 mL/min (by C-G formula based on SCr of 0.91 mg/dL).  Liver Function Tests: Recent Labs  Lab 06/06/22 1748  AST 23  ALT 30  ALKPHOS 66  BILITOT 0.9  PROT 8.4*  ALBUMIN 4.5    Urine analysis:    Component Value Date/Time   BILIRUBINUR negative 11/13/2021 1620   PROTEINUR Negative 11/13/2021 1620   UROBILINOGEN 0.2 11/13/2021 1620   NITRITE negative 11/13/2021 1620   LEUKOCYTESUR Negative 11/13/2021 1620    Radiological Exams on Admission: CT Angio Chest PE W and/or Wo Contrast  Result Date: 06/06/2022 CLINICAL DATA:  Pulmonary embolism suspected. Intermittent chest pain worsening this morning. No history of PE. Hypertension and diabetes. EXAM: CT ANGIOGRAPHY CHEST WITH CONTRAST TECHNIQUE: Multidetector CT imaging of the chest was performed using the standard protocol during bolus administration of intravenous contrast. Multiplanar CT image reconstructions and MIPs were obtained to evaluate the vascular anatomy. RADIATION DOSE REDUCTION:  This exam was performed according to the departmental dose-optimization program which includes automated exposure control, adjustment of the mA and/or kV according to patient size and/or use of iterative reconstruction technique. CONTRAST:  74m OMNIPAQUE IOHEXOL 350 MG/ML SOLN COMPARISON:  PA Lat chest today, PA Lat 04/19/2019. No prior cross-sectional imaging for comparison. FINDINGS: Cardiovascular: The pulmonary arteries are normal in caliber. The subsegmental arterial bed is largely unopacified and not evaluated due to bolus timing, but no arterial embolism is seen through the segmental divisions. Thoracic aorta and great vessels are normal. Pulmonary veins are decompressed. There is no visible coronary artery calcification no significant pericardial effusion. Mediastinum/Nodes: No enlarged mediastinal, hilar, or axillary lymph nodes. Thyroid gland, trachea, and esophagus demonstrate no significant findings. Lungs/Pleura: There is posterior subpleural atelectasis in the lower lobes. Lungs are clear of infiltrates and nodules. There are few linear scar-like opacities in both bases. Central airways are clear. There is no pleural effusion, thickening or pneumothorax. Upper Abdomen: No acute abnormality. Musculoskeletal: No chest wall abnormality. No acute or significant osseous findings. Review of the MIP images confirms the above findings. IMPRESSION: There are no acute chest CT or CTA findings. The pulmonary arteries are clear at least through the segmental divisions with the subsegmental arterial bed not well evaluated due to bolus timing. There is no evidence of acute pneumonia. Electronically Signed   By: KTelford NabM.D.   On: 06/06/2022 21:30   DG Chest 2 View  Result Date: 06/06/2022 CLINICAL DATA:  Nausea and left-sided chest pain. Hypertension. Diabetes EXAM: CHEST - 2 VIEW COMPARISON:  04/19/2019 FINDINGS: Cholecystectomy. Reverse apical lordotic positioning. Midline trachea. Normal heart size  and mediastinal contours. No pleural effusion or pneumothorax. Clear lungs. IMPRESSION: No active cardiopulmonary disease. Electronically Signed   By: KAbigail MiyamotoM.D.   On: 06/06/2022 11:38    EKG: Independently reviewed.  Sinus rhythm at 98 bpm.  Some nonspecific T wave flattening.  T wave inversion in 2 3 and aVF.  T wave  versions are similar if somewhat more pronounced when compared to previous.  Assessment/Plan Principal Problem:   Chest pain, rule out acute myocardial infarction Active Problems:   Essential hypertension   Obesity   OSA (obstructive sleep apnea)   Mixed hyperlipidemia   Hypokalemia   Chest pain, rule out ACS > Patient presenting with severe chest pain similar to previous NSTEMI with left-sided pain with associated nausea and lightheadedness. > Troponins are flat in the ED at 4 and 5 with repeat pending due to recurrent chest pain episode. > Previous NSTEMI was worked up and showed a clean cath at that time.  This pain is similar to the pain she experienced at that time. > Patient's cardiologists office, [facility] cardiology was consulted and will see the patient tomorrow recommending observation. > Extensive family history of heart attack with both parents having heart attacks in their 52s. - Appreciate cardiology recommendations. - Monitor on telemetry - Continue to trend troponin due to recurrent episode of chest pain - Hold off on echocardiogram and further cardiac studies pending cardiology recommendations tomorrow morning. - Supportive care  Hypokalemia > Potassium noted to be 2.8 in ED.  Received 40 mEq p.o. potassium. - Check magnesium - Give additional 40 mEq p.o. potassium - Trend electrolytes  Hypertension - Continue home amlodipine, hydrochlorothiazide, metoprolol  Hyperlipidemia - Continue atorvastatin  OSA > Multiple sleep issues noted - Continue home CPAP  GERD - Continue PPI  Obesity - Noted  DVT prophylaxis: Heparin, prophylaxis  dose Code Status:   Full Family Communication:  Sister updated at bedside (sister is a Marine scientist with current health)  Disposition Plan:   Patient is from:  Home  Anticipated DC to:  Home  Anticipated DC date:  1 to 2 days  Anticipated DC barriers: None  Consults called:  Cardiology, [place] cardiology, consulted by EDP Admission status:  Observation, telemetry  Severity of Illness: The appropriate patient status for this patient is OBSERVATION. Observation status is judged to be reasonable and necessary in order to provide the required intensity of service to ensure the patient's safety. The patient's presenting symptoms, physical exam findings, and initial radiographic and laboratory data in the context of their medical condition is felt to place them at decreased risk for further clinical deterioration. Furthermore, it is anticipated that the patient will be medically stable for discharge from the hospital within 2 midnights of admission.    Marcelyn Bruins MD [facility]  How to contact the [facility] Attending or Consulting provider [place] or covering provider during after hours [place], for this patient?   Check the care team in [facility] and look for a) attending/consulting [facility] provider listed and b) the [facility] team listed Log into [email or web address] and use [facility]'s universal password to access. If you do not have the password, please contact the hospital operator. Locate the [facility] provider you are looking for under [facility] and page to a number that you can be directly reached. If you still have difficulty reaching the provider, please page the [facility] (Director on Call) for the Hospitalists listed on amion for assistance.  06/06/2022, 10:15 PM

## 2022-06-06 NOTE — ED Triage Notes (Signed)
Pt BIB [facility] from work c/o intermittent left sided CP that started last night with no radiation. Pt describes it has a sharp pain. Pt endorses N/V with the CP. Fire gave 324 ASA and EMS gave '4mg'$  of zofran.

## 2022-06-07 ENCOUNTER — Observation Stay (HOSPITAL_COMMUNITY): Payer: 59

## 2022-06-07 DIAGNOSIS — R079 Chest pain, unspecified: Secondary | ICD-10-CM | POA: Diagnosis not present

## 2022-06-07 LAB — BASIC METABOLIC PANEL
Anion gap: 9 (ref 5–15)
BUN: 6 mg/dL (ref 6–20)
CO2: 22 mmol/L (ref 22–32)
Calcium: 9.3 mg/dL (ref 8.9–10.3)
Chloride: 109 mmol/L (ref 98–111)
Creatinine, Ser: 0.97 mg/dL (ref 0.44–1.00)
GFR, Estimated: >60 mL/min (ref >60)
Glucose, Bld: 96 mg/dL (ref 70–99)
Potassium: 4 mmol/L (ref 3.5–5.1)
Sodium: 140 mmol/L (ref 135–145)

## 2022-06-07 LAB — MAGNESIUM: Magnesium: 1.8 mg/dL (ref 1.7–2.4)

## 2022-06-07 LAB — LIPID PANEL
Cholesterol: 109 mg/dL (ref 0–200)
HDL: 54 mg/dL (ref >40)
LDL Cholesterol: 37 mg/dL (ref 0–99)
Total CHOL/HDL Ratio: 2 RATIO
Triglycerides: 92 mg/dL (ref <150)
VLDL: 18 mg/dL (ref 0–40)

## 2022-06-07 LAB — BRAIN NATRIURETIC PEPTIDE: B Natriuretic Peptide: 5.3 pg/mL (ref 0.0–100.0)

## 2022-06-07 LAB — HIV ANTIBODY (ROUTINE TESTING W REFLEX): HIV Screen 4th Generation wRfx: NONREACTIVE

## 2022-06-07 LAB — LDL CHOLESTEROL, DIRECT: Direct LDL: 40.7 mg/dL (ref 0–99)

## 2022-06-07 MED ORDER — REGADENOSON 0.4 MG/5ML IV SOLN
INTRAVENOUS | Status: DC
Start: 2022-06-07 — End: 2022-06-07
  Filled 2022-06-07: qty 5

## 2022-06-07 MED ORDER — TECHNETIUM TC 99M TETROFOSMIN IV KIT
10.2000 | PACK | Freq: Once | INTRAVENOUS | Status: AC | PRN
  Administered 2022-06-07: 10.2 via INTRAVENOUS

## 2022-06-07 MED ORDER — REGADENOSON 0.4 MG/5ML IV SOLN
0.4000 mg | Freq: Once | INTRAVENOUS | Status: DC
  Filled 2022-06-07: qty 5

## 2022-06-07 MED ORDER — TECHNETIUM TC 99M TETROFOSMIN IV KIT
32.9000 | PACK | Freq: Once | INTRAVENOUS | Status: AC | PRN
  Administered 2022-06-07: 32.9 via INTRAVENOUS

## 2022-06-07 NOTE — Discharge Summary (Signed)
Physician Discharge Summary  Lauren Carr VZC:[id number] DOB: 1976-12-24 DOA: 06/06/2022  PCP: [name], FNP  Admit date: 06/06/2022 Discharge date: 06/07/2022  Admitted From: Home Disposition:  Home  Discharge Condition:Stable CODE STATUS:FULL Diet recommendation: Heart Healthy  Brief/Interim Summary:  Lauren Carr is a 46 y.o. female with medical history significant of insomnia, OSA, fibromyalgia, hypertension, obesity, anxiety, NSTEMI, hyperlipidemia, GERD presenting with chest pain. Patient has some chronic intermittent chest pain for the past several years.    She stated she had some mild chest pain last night that was more typical and self resolved.   She said it felt similar to when she had a previous NSTEMI.   Vital signs in the ED significant for blood pressure in the 128N to 867E systolic and heart rate in the 90s to 100s.  Lab work-up included CMP with potassium of 2.8, glucose 155, protein 8.4.  CBC within normal limits.  Troponin were normal. Patient's chest pain spontaneously resolved during this hospitalization.  Cardiology was also consulted.  She underwent stress echo which did not show any reversible ischemia or infarction.  Cardiology cleared her for discharge today.  She will continue her home medications .  Medically stable for discharge.   discharge Diagnoses:  Principal Problem:   Chest pain, rule out acute myocardial infarction Active Problems:   Essential hypertension   Obesity   OSA (obstructive sleep apnea)   Mixed hyperlipidemia   Hypokalemia    Discharge Instructions  Discharge Instructions     Diet - low sodium heart healthy   Complete by: As directed    Discharge instructions   Complete by: As directed    1)Follow up with your PCP and cardiologist.   Increase activity slowly   Complete by: As directed       Allergies as of 06/07/2022   No Known Allergies      Medication List     TAKE these medications    amLODipine 5 MG  tablet Commonly known as: NORVASC TAKE 1 TABLET BY MOUTH EVERY DAY   Aspirin Low Dose 81 MG tablet Generic drug: aspirin EC TAKE 1 TABLET BY MOUTH EVERY DAY What changed: how much to take   atorvastatin 80 MG tablet Commonly known as: LIPITOR TAKE 1 TABLET BY MOUTH DAILY AT 6 PM. What changed: when to take this   cholecalciferol 25 MCG (1000 UNIT) tablet Commonly known as: VITAMIN D3 Take 3,000 Units by mouth daily.   Dexlansoprazole 30 MG capsule DR TAKE 1 CAPSULE BY MOUTH EVERY DAY (NOT COVERED) What changed: See the new instructions.   hydrochlorothiazide 12.5 MG tablet Commonly known as: HYDRODIURIL TAKE 1 TABLET BY MOUTH EVERY DAY   metFORMIN 500 MG tablet Commonly known as: GLUCOPHAGE Take 1 tablet (500 mg total) by mouth 2 (two) times daily with a meal.   metoprolol tartrate 25 MG tablet Commonly known as: LOPRESSOR TAKE HALF A PILL BY MOUTH 2 TIMES DAILY.   multivitamin tablet Take 1 tablet by mouth daily.   nitroGLYCERIN 0.4 MG SL tablet Commonly known as: NITROSTAT PLACE 1 TABLET UNDER THE TONGUE EVERY 5 MINUTES X 3 DOSES AS NEEDED FOR CHEST PAIN. What changed: See the new instructions.   Wegovy 0.25 MG/0.5ML Soaj Generic drug: Semaglutide-Weight Management Inject 0.25 mg into the skin once a week.        Follow-up Information     [name], FNP. Schedule an appointment as soon as possible for a visit in 1 week(s).   Specialty:  General Practice Contact information: [address] [place] [address] [phone]                No Known Allergies  Consultations: cardiology   Procedures/Studies: NM Myocar Multi W/Spect W/Wall Motion / EF  Result Date: 06/07/2022 CLINICAL DATA:  46 year old female.  Chest pain. EXAM: MYOCARDIAL IMAGING WITH SPECT (REST AND EXERCISE) GATED LEFT VENTRICULAR WALL MOTION STUDY LEFT VENTRICULAR EJECTION FRACTION TECHNIQUE: Standard myocardial SPECT imaging was performed after resting  intravenous injection of 10 mCi Tc-14mtetrofosmin. Subsequently, exercise tolerance test was performed by the patient under the supervision of the Cardiology staff. At peak-stress, 30 mCi Tc-958metrofosminwas injected intravenously and standard myocardial SPECT imaging was performed. Quantitative gated imaging was also performed to evaluate left ventricular wall motion, and estimate left ventricular ejection fraction. COMPARISON:  None Available. FINDINGS: Perfusion: No decreased activity in the left ventricle on stress imaging to suggest reversible ischemia or infarction. Wall Motion: Normal left ventricular wall motion. No left ventricular dilation. Left Ventricular Ejection Fraction: 68 % End diastolic volume 85 ml End systolic volume 27 ml IMPRESSION: 1. No reversible ischemia or infarction. 2. Normal left ventricular wall motion. 3. Left ventricular ejection fraction 68% 4. Non invasive risk stratification*: Low *2012 Appropriate Use Criteria for Coronary Revascularization Focused Update: J Am Coll Cardiol. 204034;74(2):595-638http://[email or web address]?articleid=[email or web address] Electronically Signed   By: StSuzy Bouchard.D.   On: 06/07/2022 14:33   CT Angio Chest PE W and/or Wo Contrast  Result Date: 06/06/2022 CLINICAL DATA:  Pulmonary embolism suspected. Intermittent chest pain worsening this morning. No history of PE. Hypertension and diabetes. EXAM: CT ANGIOGRAPHY CHEST WITH CONTRAST TECHNIQUE: Multidetector CT imaging of the chest was performed using the standard protocol during bolus administration of intravenous contrast. Multiplanar CT image reconstructions and MIPs were obtained to evaluate the vascular anatomy. RADIATION DOSE REDUCTION: This exam was performed according to the departmental dose-optimization program which includes automated exposure control, adjustment of the mA and/or kV according to patient size and/or use of iterative reconstruction technique. CONTRAST:  807mOMNIPAQUE IOHEXOL 350 MG/ML SOLN COMPARISON:  PA Lat chest today, PA Lat 04/19/2019. No prior cross-sectional imaging for comparison. FINDINGS: Cardiovascular: The pulmonary arteries are normal in caliber. The subsegmental arterial bed is largely unopacified and not evaluated due to bolus timing, but no arterial embolism is seen through the segmental divisions. Thoracic aorta and great vessels are normal. Pulmonary veins are decompressed. There is no visible coronary artery calcification no significant pericardial effusion. Mediastinum/Nodes: No enlarged mediastinal, hilar, or axillary lymph nodes. Thyroid gland, trachea, and esophagus demonstrate no significant findings. Lungs/Pleura: There is posterior subpleural atelectasis in the lower lobes. Lungs are clear of infiltrates and nodules. There are few linear scar-like opacities in both bases. Central airways are clear. There is no pleural effusion, thickening or pneumothorax. Upper Abdomen: No acute abnormality. Musculoskeletal: No chest wall abnormality. No acute or significant osseous findings. Review of the MIP images confirms the above findings. IMPRESSION: There are no acute chest CT or CTA findings. The pulmonary arteries are clear at least through the segmental divisions with the subsegmental arterial bed not well evaluated due to bolus timing. There is no evidence of acute pneumonia. Electronically Signed   By: KeiTelford NabD.   On: 06/06/2022 21:30   DG Chest 2 View  Result Date: 06/06/2022 CLINICAL DATA:  Nausea and left-sided chest pain. Hypertension. Diabetes EXAM: CHEST - 2 VIEW COMPARISON:  04/19/2019 FINDINGS: Cholecystectomy. Reverse apical lordotic positioning. Midline trachea. Normal  heart size and mediastinal contours. No pleural effusion or pneumothorax. Clear lungs. IMPRESSION: No active cardiopulmonary disease. Electronically Signed   By: Abigail Miyamoto M.D.   On: 06/06/2022 11:38      Subjective: Patient seen and examined at the  bedside in the emergency department.  During my evaluation, she denied complaint of chest pain.  She was medically stable for dc  Discharge Exam: Vitals:   06/07/22 1252 06/07/22 1410  BP: 131/70 134/76  Pulse: 100 89  Resp:  17  Temp:  98.5 F (36.9 C)  SpO2:  98%   Vitals:   06/07/22 1246 06/07/22 1249 06/07/22 1252 06/07/22 1410  BP: (!) 192/49 (!) 146/66 131/70 134/76  Pulse: (!) 123 98 100 89  Resp:    17  Temp:    98.5 F (36.9 C)  TempSrc:    Oral  SpO2:    98%  Weight:      Height:        General: Pt is alert, awake, not in acute distress,obese Cardiovascular: RRR, S1/S2 +, no rubs, no gallops Respiratory: CTA bilaterally, no wheezing, no rhonchi Abdominal: Soft, NT, ND, bowel sounds + Extremities: no edema, no cyanosis    The results of significant diagnostics from this hospitalization (including imaging, microbiology, ancillary and laboratory) are listed below for reference.     Microbiology: No results found for this or any previous visit (from the past 240 hour(s)).   Labs: BNP (last 3 results) Recent Labs    06/07/22 0857  BNP 5.3   Basic Metabolic Panel: Recent Labs  Lab 06/06/22 1058 06/06/22 2347 06/07/22 0857  NA 138  --  140  K 2.8*  --  4.0  CL 103  --  109  CO2 24  --  22  GLUCOSE 155*  --  96  BUN 9  --  6  CREATININE 0.91  --  0.97  CALCIUM 9.6  --  9.3  MG  --  1.8  --    Liver Function Tests: Recent Labs  Lab 06/06/22 1748  AST 23  ALT 30  ALKPHOS 66  BILITOT 0.9  PROT 8.4*  ALBUMIN 4.5   Recent Labs  Lab 06/06/22 1748  LIPASE 30   No results for input(s): "AMMONIA" in the last 168 hours. CBC: Recent Labs  Lab 06/06/22 1058  WBC 6.5  HGB 12.1  HCT 37.4  MCV 82.6  PLT 251   Cardiac Enzymes: No results for input(s): "CKTOTAL", "CKMB", "CKMBINDEX", "TROPONINI" in the last 168 hours. BNP: Invalid input(s): "POCBNP" CBG: No results for input(s): "GLUCAP" in the last 168 hours. D-Dimer No results for  input(s): "DDIMER" in the last 72 hours. Hgb A1c No results for input(s): "HGBA1C" in the last 72 hours. Lipid Profile Recent Labs    06/07/22 0857  CHOL 109  HDL 54  LDLCALC 37  TRIG 92  CHOLHDL 2.0  LDLDIRECT 40.7   Thyroid function studies No results for input(s): "TSH", "T4TOTAL", "T3FREE", "THYROIDAB" in the last 72 hours.  Invalid input(s): "FREET3" Anemia work up No results for input(s): "VITAMINB12", "FOLATE", "FERRITIN", "TIBC", "IRON", "RETICCTPCT" in the last 72 hours. Urinalysis    Component Value Date/Time   BILIRUBINUR negative 11/13/2021 1620   PROTEINUR Negative 11/13/2021 1620   UROBILINOGEN 0.2 11/13/2021 1620   NITRITE negative 11/13/2021 1620   LEUKOCYTESUR Negative 11/13/2021 1620   Sepsis Labs Recent Labs  Lab 06/06/22 1058  WBC 6.5   Microbiology No results found for this or any  previous visit (from the past 240 hour(s)).  Please note: You were cared for by a hospitalist during your hospital stay. Once you are discharged, your primary care physician will handle any further medical issues. Please note that NO REFILLS for any discharge medications will be authorized once you are discharged, as it is imperative that you return to your primary care physician (or establish a relationship with a primary care physician if you do not have one) for your post hospital discharge needs so that they can reassess your need for medications and monitor your lab values.    Time coordinating discharge: 40 minutes  SIGNED:   Shelly Coss, MD  [facility] 06/07/2022, 3:57 PM Pager [phone]  If 7PM-7AM, please contact night-coverage [email or web address] Password [email or web address]

## 2022-06-07 NOTE — Consult Note (Addendum)
CARDIOLOGY CONSULT NOTE  Patient ID: TRAVIS PURK MRN: [id number] DOB/AGE: December 18, 1976 46 y.o.  Admit date: 06/06/2022 Attending physician: Shelly Coss, MD Primary Physician:  [name], [facility] Outpatient Cardiologist: Dr. Virgina Jock Inpatient Cardiologist: Rex Kras, DO, [facility]  Reason of consultation: Chest Pain Referring physician: Dr. Ronnald Nian and Shelby Dubin PA-C  Chief complaint: chest pain  HPI:  Lauren Carr is a 46 y.o. African-American female who presents with a chief complaint of " chest pain." Her past medical history and cardiovascular risk factors include: HTN, NIDDM, HLD, non-smoker, family hx of premature CAD.  Patient started experiencing chest pain yesterday morning still chose to go to work.  While she was at work she started experiencing sharp left sided precordial discomfort, nonexertional, did not resolve with resting, lasting for a minute and self-limited.  Given her history and strong family history of CAD she came to the ED for further evaluation and management.  Patient states that when she was admitted her discomfort was 10 out of 10.  Currently does not have any precordial pain.  Patient denies any heart failure symptoms.  No use of sublingual nitroglycerin tablets.  She presented to the ED as non-STEMI back in April 2020 and states that the current discomfort is not her anginal equivalents.  ALLERGIES: No Known Allergies  PAST MEDICAL HISTORY: Past Medical History:  Diagnosis Date   Diabetes mellitus without complication (HCC)    GERD (gastroesophageal reflux disease)    Heart murmur    Hyperlipidemia    Hypertension    NSTEMI (non-ST elevated myocardial infarction) ([place])    Obstructive sleep apnea    Oxygen deficiency    PONV (postoperative nausea and vomiting)    Sleep apnea     PAST SURGICAL HISTORY: Past Surgical History:  Procedure Laterality Date   CESAREAN SECTION     CHOLECYSTECTOMY     LAPAROSCOPY  1998   LEFT  HEART CATH AND CORONARY ANGIOGRAPHY N/A 04/20/2019   Procedure: LEFT HEART CATH AND CORONARY ANGIOGRAPHY;  Surgeon: Nigel Mormon, MD;  Location: [facility];  Service: Cardiovascular;  Laterality: N/A;    FAMILY HISTORY: The patient's family history includes Breast cancer in her mother; CAD in her maternal grandfather and paternal grandfather; Cancer - Cervical in her maternal grandmother; Cancer - Prostate in her father; Colon polyps in her mother; Diabetes in her maternal grandmother; Hypertension in her father and mother; Long QT syndrome in her mother.   SOCIAL HISTORY:  The patient  reports that she has never smoked. She has never used smokeless tobacco. She reports that she does not currently use alcohol. She reports that she does not use drugs.  MEDICATIONS: Current Outpatient Medications  Medication Instructions   amLODipine (NORVASC) 5 MG tablet TAKE 1 TABLET BY MOUTH EVERY DAY   ASPIRIN LOW DOSE 81 MG EC tablet TAKE 1 TABLET BY MOUTH EVERY DAY   atorvastatin (LIPITOR) 80 MG tablet TAKE 1 TABLET BY MOUTH DAILY AT 6 PM.   cholecalciferol (VITAMIN D3) 3,000 Units, Oral, Daily   Dexlansoprazole 30 MG capsule DR TAKE 1 CAPSULE BY MOUTH EVERY DAY (NOT COVERED)   hydrochlorothiazide (HYDRODIURIL) 12.5 MG tablet TAKE 1 TABLET BY MOUTH EVERY DAY   metFORMIN (GLUCOPHAGE) 500 mg, Oral, 2 times daily with meals   metoprolol tartrate (LOPRESSOR) 25 MG tablet TAKE HALF A PILL BY MOUTH 2 TIMES DAILY.   Multiple Vitamin (MULTIVITAMIN) tablet 1 tablet, Oral, Daily   nitroGLYCERIN (NITROSTAT) 0.4 MG SL tablet PLACE 1 TABLET  UNDER THE TONGUE EVERY 5 MINUTES X 3 DOSES AS NEEDED FOR CHEST PAIN.   Wegovy 0.25 mg, Subcutaneous, Weekly    REVIEW OF SYSTEMS: Review of Systems  Cardiovascular:  Positive for chest pain (see HPI, no active pain). Negative for dyspnea on exertion, leg swelling, near-syncope, orthopnea, palpitations, paroxysmal nocturnal dyspnea and syncope.  Respiratory:   Negative for shortness of breath.   All other systems reviewed and are negative.   PHYSICAL EXAM:    06/07/2022    7:56 AM 06/07/2022    5:05 AM 06/07/2022    1:11 AM  Vitals with BMI  Systolic 563 149 702  Diastolic 78 79 78  Pulse 86 81 79    No intake or output data in the 24 hours ending 06/07/22 0857  Net IO Since Admission: No IO data has been entered for this period [06/07/22 0857]  CONSTITUTIONAL: Well-developed and well-nourished. No acute distress.  SKIN: Skin is warm and dry. No rash noted. No cyanosis. No pallor. No jaundice HEAD: Normocephalic and atraumatic.  EYES: No scleral icterus MOUTH/THROAT: Moist oral membranes.  NECK: No JVD present. No thyromegaly noted. No carotid bruits  CHEST Normal respiratory effort. No intercostal retractions  LUNGS:  Clear to auscultation bilaterally.  No stridor. No wheezes. No rales.  CARDIOVASCULAR:  Regular rate and rhythm, positive S1-S2, no murmurs rubs or gallops appreciated ABDOMINAL:Soft, nontender, nondistended, positive bowel sounds in all 4 quadrants, no apparent ascites.  EXTREMITIES: no pitting edema, warm to touch.  HEMATOLOGIC: No significant bruising NEUROLOGIC: Oriented to person, place, and time. Nonfocal. Normal muscle tone.  PSYCHIATRIC: Normal mood and affect. Normal behavior. Cooperative  RADIOLOGY: CT Angio Chest PE W and/or Wo Contrast  Result Date: 06/06/2022 CLINICAL DATA:  Pulmonary embolism suspected. Intermittent chest pain worsening this morning. No history of PE. Hypertension and diabetes. EXAM: CT ANGIOGRAPHY CHEST WITH CONTRAST TECHNIQUE: Multidetector CT imaging of the chest was performed using the standard protocol during bolus administration of intravenous contrast. Multiplanar CT image reconstructions and MIPs were obtained to evaluate the vascular anatomy. RADIATION DOSE REDUCTION: This exam was performed according to the departmental dose-optimization program which includes automated exposure  control, adjustment of the mA and/or kV according to patient size and/or use of iterative reconstruction technique. CONTRAST:  51m OMNIPAQUE IOHEXOL 350 MG/ML SOLN COMPARISON:  PA Lat chest today, PA Lat 04/19/2019. No prior cross-sectional imaging for comparison. FINDINGS: Cardiovascular: The pulmonary arteries are normal in caliber. The subsegmental arterial bed is largely unopacified and not evaluated due to bolus timing, but no arterial embolism is seen through the segmental divisions. Thoracic aorta and great vessels are normal. Pulmonary veins are decompressed. There is no visible coronary artery calcification no significant pericardial effusion. Mediastinum/Nodes: No enlarged mediastinal, hilar, or axillary lymph nodes. Thyroid gland, trachea, and esophagus demonstrate no significant findings. Lungs/Pleura: There is posterior subpleural atelectasis in the lower lobes. Lungs are clear of infiltrates and nodules. There are few linear scar-like opacities in both bases. Central airways are clear. There is no pleural effusion, thickening or pneumothorax. Upper Abdomen: No acute abnormality. Musculoskeletal: No chest wall abnormality. No acute or significant osseous findings. Review of the MIP images confirms the above findings. IMPRESSION: There are no acute chest CT or CTA findings. The pulmonary arteries are clear at least through the segmental divisions with the subsegmental arterial bed not well evaluated due to bolus timing. There is no evidence of acute pneumonia. Electronically Signed   By: KTelford NabM.D.   On: 06/06/2022 21:30  DG Chest 2 View  Result Date: 06/06/2022 CLINICAL DATA:  Nausea and left-sided chest pain. Hypertension. Diabetes EXAM: CHEST - 2 VIEW COMPARISON:  04/19/2019 FINDINGS: Cholecystectomy. Reverse apical lordotic positioning. Midline trachea. Normal heart size and mediastinal contours. No pleural effusion or pneumothorax. Clear lungs. IMPRESSION: No active cardiopulmonary  disease. Electronically Signed   By: Abigail Miyamoto M.D.   On: 06/06/2022 11:38    LABORATORY DATA: Lab Results  Component Value Date   WBC 6.5 06/06/2022   HGB 12.1 06/06/2022   HCT 37.4 06/06/2022   MCV 82.6 06/06/2022   PLT 251 06/06/2022    Recent Labs  Lab 06/06/22 1058 06/06/22 1748  NA 138  --   K 2.8*  --   CL 103  --   CO2 24  --   BUN 9  --   CREATININE 0.91  --   CALCIUM 9.6  --   PROT  --  8.4*  BILITOT  --  0.9  ALKPHOS  --  66  ALT  --  30  AST  --  23  GLUCOSE 155*  --     Lipid Panel  Lab Results  Component Value Date   CHOL 156 11/13/2021   HDL 67 11/13/2021   LDLCALC 70 11/13/2021   TRIG 104 11/13/2021   CHOLHDL 2.3 11/13/2021    BNP (last 3 results) No results for input(s): "BNP" in the last 8760 hours.  HEMOGLOBIN A1C Lab Results  Component Value Date   HGBA1C 6.2 (H) 05/15/2022    Cardiac Panel (last 3 results) Recent Labs    06/06/22 1058 06/06/22 1748 06/06/22 2144  TROPONINIHS '4 5 5     '$ TSH No results for input(s): "TSH" in the last 8760 hours.   CARDIAC DATABASE: EKG: 06/06/2022: Normal sinus rhythm, 90 bpm, T wave inversion in the inferolateral leads concerning for possible ischemia, without underlying injury pattern.  When compared to prior EKG TWI are not new.  Coronary angiogram 04/20/2019: LM: Normal LAD: Medium caliber. Mid LAD myocardial bridge. Small diagonal branches. No significant stenoses. LCx: Small caliber. Normal RCA: Dominant. No stenoses LVEDP 22 mmHg   Impression: No angiographically evident coronary artery disease   Recommendation: One year DAPT for medical treatment of NSTEMI   Echocardiogram 04/19/2019:  1. Mild hypokinesis of the left ventricular, basal-mid inferolateral wall.  2. The left ventricle has normal systolic function, with an ejection fraction of 60-65%. Left ventricular diastolic function could not be evaluated.  3. The right ventricle has normal systolc function. The cavity was  normal.  4. The aortic valve is tricuspid Aortic valve regurgitation is mild by color flow Doppler.  5. The aortic root and ascending aorta are normal in size and structure.  IMPRESSION & RECOMMENDATIONS: Lauren Carr is a 46 y.o. African-American female whose past medical history and cardiovascular risk factors include: HTN, NIDDM, HLD, non-smoker, family hx of premature CAD.  Impression:  Precordial pain. History of non-STEMI. Abnormal EKG. Hypertension. Non-insulin-dependent diabetes mellitus type 2. Hyperlipidemia. Family history of premature CAD  Plan:  Precordial pain/history of non-STEMI/abnormal EKG: Patient presents with precordial pain suggestive of noncardiac etiology.  But has multiple cardiovascular risk factors including premature CAD and diabetes.  The symptoms that she presents today are not similar to her NSTEMI back in 2020 but concerning enough to seek medical attention.  Her high sensitive troponins have been negative x3.  EKG shows sinus rhythm with T wave inversions in the inferolateral leads which have been present intermittently  on her prior ECG.   Shared decision is to proceed with exercise nuclear stress test to evaluate for functional status and reversible ischemia.  Please keep patient NPO.  We will check fasting lipid profile, direct LDL, BNP, and urine pregnancy test   As part of this consultation reviewed prior EKGs, heart catheterization reports, echo reports, independently review of labs and ED documentation.  I have spoke to the referring physician as well as attending physician and recommendations have been conveyed.  Patient's questions and concerns were addressed to her satisfaction. She voices understanding of the instructions provided during this encounter.   This note was created using a voice recognition software as a result there may be grammatical errors inadvertently enclosed that do not reflect the nature of this encounter. Every  attempt is made to correct such errors.  Mechele Claude [place] [facility]  Pager: [phone] Office: [phone] 06/07/2022, 8:57 AM   ADDENDUM: Exercise nuclear stress test: 1. No reversible ischemia or infarction.   2. Normal left ventricular wall motion.   3. Left ventricular ejection fraction 68%   4. Non invasive risk stratification*: Low  Patient's nuclear stress test was reported to be low risk.  She was chest pain-free at the time of the stress test.  If medically stable patient can be discharged home from a cardiovascular standpoint.  We will arrange outpatient follow-up visit in the next 2-3 weeks.  Recommendations conveyed to primary team.  Rex Kras, DO, [facility]  Pager: [phone] Office: [phone]

## 2022-06-07 NOTE — ED Notes (Signed)
Pt off the floor in stress test

## 2022-06-11 ENCOUNTER — Telehealth: Payer: Self-pay

## 2022-06-11 NOTE — Telephone Encounter (Signed)
Transition Care Management Unsuccessful Follow-up Telephone Call  Date of discharge and from where:  06/07/2022   Attempts:  1st Attempt  Reason for unsuccessful TCM follow-up call:  Left voice message

## 2022-06-13 ENCOUNTER — Ambulatory Visit: Payer: 59 | Admitting: Nurse Practitioner

## 2022-06-18 ENCOUNTER — Other Ambulatory Visit: Payer: Self-pay | Admitting: Nurse Practitioner

## 2022-06-28 ENCOUNTER — Ambulatory Visit: Payer: Self-pay | Admitting: Cardiology

## 2022-06-28 ENCOUNTER — Encounter: Payer: Self-pay | Admitting: Cardiology

## 2022-06-28 VITALS — BP 120/78 | HR 74 | Temp 98.3°F | Resp 16 | Ht 68.0 in | Wt 233.0 lb

## 2022-06-28 DIAGNOSIS — R072 Precordial pain: Secondary | ICD-10-CM

## 2022-06-28 NOTE — Progress Notes (Signed)
EKG 06/29/2022: Sinus rhythm 72 bpm Left axis deviation Old anteroseptal infarct Low voltage in precordial leads  Poor R wave progression Nonspecific T-abnormality  Patient could not seen due to scheduling conflict.  No charge. Patient will be rescheduled.    Nigel Mormon, MD Pager: [phone] Office: [phone]

## 2022-06-29 ENCOUNTER — Encounter: Payer: Self-pay | Admitting: Cardiology

## 2022-07-02 ENCOUNTER — Other Ambulatory Visit: Payer: Self-pay | Admitting: Nurse Practitioner

## 2022-07-02 DIAGNOSIS — K219 Gastro-esophageal reflux disease without esophagitis: Secondary | ICD-10-CM

## 2022-08-03 ENCOUNTER — Other Ambulatory Visit: Payer: Self-pay | Admitting: Nurse Practitioner

## 2022-08-03 DIAGNOSIS — K219 Gastro-esophageal reflux disease without esophagitis: Secondary | ICD-10-CM

## 2022-08-07 ENCOUNTER — Other Ambulatory Visit: Payer: Self-pay | Admitting: Nurse Practitioner

## 2022-08-19 ENCOUNTER — Other Ambulatory Visit: Payer: Self-pay | Admitting: Nurse Practitioner

## 2022-08-19 DIAGNOSIS — K219 Gastro-esophageal reflux disease without esophagitis: Secondary | ICD-10-CM

## 2022-08-26 ENCOUNTER — Other Ambulatory Visit: Payer: Self-pay | Admitting: Cardiology

## 2022-08-26 DIAGNOSIS — I214 Non-ST elevation (NSTEMI) myocardial infarction: Secondary | ICD-10-CM

## 2022-10-15 ENCOUNTER — Other Ambulatory Visit: Payer: Self-pay | Admitting: Nurse Practitioner

## 2022-10-15 DIAGNOSIS — R7309 Other abnormal glucose: Secondary | ICD-10-CM

## 2022-10-22 ENCOUNTER — Ambulatory Visit: Payer: Self-pay | Admitting: Cardiology

## 2022-10-22 NOTE — Progress Notes (Signed)
No show

## 2022-11-13 ENCOUNTER — Other Ambulatory Visit: Payer: Self-pay | Admitting: Nurse Practitioner

## 2022-11-20 ENCOUNTER — Encounter: Payer: 59 | Admitting: Nurse Practitioner

## 2022-11-20 NOTE — Progress Notes (Signed)
Cancelled.  

## 2022-11-20 NOTE — Patient Instructions (Signed)

## 2022-12-05 ENCOUNTER — Other Ambulatory Visit: Payer: Self-pay | Admitting: Cardiology

## 2022-12-05 DIAGNOSIS — I10 Essential (primary) hypertension: Secondary | ICD-10-CM

## 2022-12-12 ENCOUNTER — Emergency Department (HOSPITAL_BASED_OUTPATIENT_CLINIC_OR_DEPARTMENT_OTHER)
Admission: EM | Admit: 2022-12-12 | Discharge: 2022-12-12 | Disposition: A | Discharge status: Home / Self Care | Payer: 59 | Attending: Emergency Medicine | Admitting: Emergency Medicine

## 2022-12-12 ENCOUNTER — Emergency Department (HOSPITAL_BASED_OUTPATIENT_CLINIC_OR_DEPARTMENT_OTHER): Payer: 59

## 2022-12-12 ENCOUNTER — Telehealth: Payer: Self-pay

## 2022-12-12 ENCOUNTER — Encounter (HOSPITAL_BASED_OUTPATIENT_CLINIC_OR_DEPARTMENT_OTHER): Payer: Self-pay

## 2022-12-12 ENCOUNTER — Other Ambulatory Visit: Payer: Self-pay

## 2022-12-12 DIAGNOSIS — I10 Essential (primary) hypertension: Secondary | ICD-10-CM | POA: Diagnosis not present

## 2022-12-12 DIAGNOSIS — I471 Supraventricular tachycardia, unspecified: Secondary | ICD-10-CM | POA: Diagnosis not present

## 2022-12-12 DIAGNOSIS — E119 Type 2 diabetes mellitus without complications: Secondary | ICD-10-CM | POA: Diagnosis not present

## 2022-12-12 DIAGNOSIS — Z7984 Long term (current) use of oral hypoglycemic drugs: Secondary | ICD-10-CM | POA: Diagnosis not present

## 2022-12-12 DIAGNOSIS — R002 Palpitations: Secondary | ICD-10-CM | POA: Diagnosis present

## 2022-12-12 LAB — CBC
HCT: 38.3 % (ref 36.0–46.0)
Hemoglobin: 12.2 g/dL (ref 12.0–15.0)
MCH: 25.5 pg — ABNORMAL LOW (ref 26.0–34.0)
MCHC: 31.9 g/dL (ref 30.0–36.0)
MCV: 80.1 fL (ref 80.0–100.0)
Platelets: 282 10*3/uL (ref 150–400)
RBC: 4.78 MIL/uL (ref 3.87–5.11)
RDW: 14.8 % (ref 11.5–15.5)
WBC: 5.2 10*3/uL (ref 4.0–10.5)
nRBC: 0 % (ref 0.0–0.2)

## 2022-12-12 LAB — BASIC METABOLIC PANEL
Anion gap: 14 (ref 5–15)
BUN: 12 mg/dL (ref 6–20)
CO2: 22 mmol/L (ref 22–32)
Calcium: 10 mg/dL (ref 8.9–10.3)
Chloride: 102 mmol/L (ref 98–111)
Creatinine, Ser: 0.63 mg/dL (ref 0.44–1.00)
GFR, Estimated: >60 mL/min (ref >60)
Glucose, Bld: 79 mg/dL (ref 70–99)
Potassium: 3.8 mmol/L (ref 3.5–5.1)
Sodium: 138 mmol/L (ref 135–145)

## 2022-12-12 LAB — HCG, SERUM, QUALITATIVE: Preg, Serum: NEGATIVE

## 2022-12-12 MED ORDER — POTASSIUM CHLORIDE CRYS ER 20 MEQ PO TBCR: 20.0000 meq | EXTENDED_RELEASE_TABLET | Freq: Every day | ORAL | 0 refills | Status: DC

## 2022-12-12 MED ORDER — POTASSIUM CHLORIDE CRYS ER 20 MEQ PO TBCR
40.0000 meq | EXTENDED_RELEASE_TABLET | Freq: Once | ORAL | Status: AC
  Administered 2022-12-12: 40 meq via ORAL
  Filled 2022-12-12: qty 2

## 2022-12-12 MED ORDER — ADENOSINE 6 MG/2ML IV SOLN
12.0000 mg | Freq: Once | INTRAVENOUS | Status: AC
  Administered 2022-12-12: 12 mg via INTRAVENOUS
  Filled 2022-12-12: qty 4

## 2022-12-12 MED ORDER — METOPROLOL TARTRATE 25 MG PO TABS
25.0000 mg | ORAL_TABLET | Freq: Once | ORAL | Status: AC
  Administered 2022-12-12: 25 mg via ORAL
  Filled 2022-12-12: qty 1

## 2022-12-12 NOTE — ED Provider Notes (Signed)
[facility] EMERGENCY DEPT Provider Note  CSN: [id number] Arrival date & time: 12/12/22 1353  Chief Complaint(s) Tachycardia  HPI Lauren Carr is a 46 y.o. female with history of diabetes, NSTEMI in 2020 although found to have no coronary artery disease on angiography, hypertension, hyperlipidemia presenting to the emergency department with palpitations.  She reports the palpitations began today, suddenly while at work.  She denies any shortness of breath or chest pain.  She reports mild lightheadedness.  No nausea, vomiting.  No abdominal pain.  No other new symptoms such as fevers, chills, heat or cold sensitivity, hair loss, weight loss.  Reports had a similar episode, EMS came to her house and gave her medicine and then she did not need to go to the hospital.   Past Medical History Past Medical History:  Diagnosis Date   Diabetes mellitus without complication ([place])    GERD (gastroesophageal reflux disease)    Heart murmur    Hyperlipidemia    Hypertension    NSTEMI (non-ST elevated myocardial infarction) ([place])    Obstructive sleep apnea    Oxygen deficiency    PONV (postoperative nausea and vomiting)    Sleep apnea    Patient Active Problem List   Diagnosis Date Noted   Precordial pain 06/06/2022   Hypokalemia 06/06/2022   Mixed hyperlipidemia 06/14/2020   Chest pain of uncertain etiology 25/00/3704   H/O non-ST elevation myocardial infarction (NSTEMI) 04/30/2019   ACS (acute coronary syndrome) ([place]) 04/19/2019   NSTEMI (non-ST elevated myocardial infarction) ([place])    OSA (obstructive sleep apnea) 11/24/2018   Fibromyalgia 09/13/2018   Allergic rhinitis 09/13/2018   Essential hypertension 09/13/2018   Abnormal glucose 09/13/2018   Obesity 09/13/2018   Vitamin D deficiency 09/13/2018   Anxiety 09/13/2018   Adjustment insomnia 08/27/2017   Snoring 08/27/2017   Palpitations 08/27/2017   Diaphoresis 08/27/2017   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 Authorizing Provider  amLODipine (NORVASC) 5 MG tablet TAKE 1 TABLET BY MOUTH EVERY DAY Patient taking differently: Take 5 mg by mouth daily. 04/09/22   Patwardhan, Reynold Bowen, MD  atorvastatin (LIPITOR) 80 MG tablet TAKE 1 TABLET BY MOUTH DAILY AT 6 PM. Patient taking differently: Take 80 mg by mouth daily. 04/30/22   Patwardhan, Reynold Bowen, MD  cholecalciferol (VITAMIN D3) 25 MCG (1000 UT) tablet Take 3,000 Units by mouth daily.    [provider]  [facility] ASPIRIN LOW DOSE 81 MG tablet TAKE 1 TABLET BY MOUTH EVERY DAY 08/28/22   Patwardhan, Manish J, MD  Dexlansoprazole 30 MG capsule DR TAKE 1 CAPSULE BY MOUTH EVERY DAY (NOT COVERED) 08/20/22   [name], FNP  hydrochlorothiazide (HYDRODIURIL) 12.5 MG tablet TAKE 1 TABLET BY MOUTH EVERY DAY 11/13/22   [name], FNP  metFORMIN (GLUCOPHAGE) 500 MG tablet TAKE 1 TABLET BY MOUTH 2 TIMES DAILY WITH A MEAL. 10/16/22   [name], FNP  metoprolol tartrate (LOPRESSOR) 25 MG tablet TAKE HALF A PILL BY MOUTH 2 TIMES DAILY. 12/07/22   Patwardhan, Reynold Bowen, MD  Multiple Vitamin (MULTIVITAMIN) tablet Take 1 tablet by mouth daily.    [provider]  nitroGLYCERIN (NITROSTAT) 0.4 MG SL tablet PLACE 1 TABLET UNDER THE TONGUE EVERY 5 MINUTES X 3 DOSES AS NEEDED FOR CHEST PAIN. Patient taking differently: Place 0.4 mg under the tongue every 5 (five) minutes as needed for chest pain. 12/19/20   Patwardhan, Manish J,  MD  WEGOVY 0.25 MG/0.5ML SOAJ Inject 0.25 mg under the skin once weekly. 06/22/22   [name], FNP                                                                                                                                    Past Surgical History Past Surgical History:  Procedure Laterality Date   CESAREAN SECTION     CHOLECYSTECTOMY     LAPAROSCOPY  1998   LEFT HEART CATH AND CORONARY ANGIOGRAPHY N/A 04/20/2019   Procedure:  LEFT HEART CATH AND CORONARY ANGIOGRAPHY;  Surgeon: Nigel Mormon, MD;  Location: [facility];  Service: Cardiovascular;  Laterality: N/A;   Family History Family History  Problem Relation Age of Onset   Colon polyps Mother    Breast cancer Mother        early 9s   Hypertension Mother    Long QT syndrome Mother        Diagnosed in 46s after syncope. Has ICD placed   Cancer - Prostate Father    Hypertension Father    Cancer - Cervical Maternal Grandmother    Diabetes Maternal Grandmother    CAD Maternal Grandfather    CAD Paternal Grandfather    Colon cancer Neg Hx    Esophageal cancer Neg Hx    Rectal cancer Neg Hx    Stomach cancer Neg Hx     Social History Social History   Tobacco Use   Smoking status: Never   Smokeless tobacco: Never  Vaping Use   Vaping Use: Never used  Substance Use Topics   Alcohol use: Not Currently    Comment: occasionally   Drug use: No   Allergies Patient has no known allergies.  Review of Systems Review of Systems  All other systems reviewed and are negative.   Physical Exam Vital Signs  I have reviewed the triage vital signs BP 138/77   Pulse 91   Temp 98.1 F (36.7 C)   Resp 10   Ht '5\' 8"'$  (1.727 m)   Wt 105.7 kg   SpO2 99%   BMI 35.43 kg/m  Physical Exam Vitals and nursing note reviewed.  Constitutional:      General: She is not in acute distress.    Appearance: She is well-developed.  HENT:     Head: Normocephalic and atraumatic.     Mouth/Throat:     Mouth: Mucous membranes are moist.  Eyes:     Pupils: Pupils are equal, round, and reactive to light.  Cardiovascular:     Rate and Rhythm: Regular rhythm. Tachycardia present.     Heart sounds: No murmur heard. Pulmonary:     Effort: Pulmonary effort is normal. No respiratory distress.     Breath sounds: Normal breath sounds.  Abdominal:     General: Abdomen is flat.     Palpations: Abdomen is soft.     Tenderness: There  is no abdominal  tenderness.  Musculoskeletal:        General: No tenderness.     Right lower leg: No edema.     Left lower leg: No edema.  Skin:    General: Skin is warm and dry.  Neurological:     General: No focal deficit present.     Mental Status: She is alert. Mental status is at baseline.  Psychiatric:        Mood and Affect: Mood normal.        Behavior: Behavior normal.     ED Results and Treatments Labs (all labs ordered are listed, but only abnormal results are displayed) Labs Reviewed  CBC - Abnormal; Notable for the following components:      Result Value   MCH 25.5 (*)    All other components within normal limits  HCG, SERUM, QUALITATIVE  BASIC METABOLIC PANEL  MAGNESIUM                                                                                                                          Radiology DG Chest Portable 1 View  Result Date: 12/12/2022 CLINICAL DATA:  Tachycardia EXAM: PORTABLE CHEST 1 VIEW COMPARISON:  Chest 06/06/2022 FINDINGS: The heart size and mediastinal contours are within normal limits. Both lungs are clear. The visualized skeletal structures are unremarkable. IMPRESSION: No active disease. Electronically Signed   By: Franchot Gallo M.D.   On: 12/12/2022 14:50    Pertinent labs & imaging results that were available during my care of the patient were reviewed by me and considered in my medical decision making (see MDM for details).  Medications Ordered in ED Medications  adenosine (ADENOCARD) 6 MG/2ML injection 12 mg (12 mg Intravenous Given 12/12/22 1434)                                                                                                                                     Procedures .Critical Care  Performed by: Cristie Hem, MD Authorized by: Cristie Hem, MD   Critical care provider statement:    Critical care time (minutes):  30   Critical care time was exclusive of:  Separately billable procedures and treating other  patients   Critical care was necessary to treat or prevent imminent or life-threatening deterioration of the following conditions:  Cardiac failure   Critical care was time spent  personally by me on the following activities:  Development of treatment plan with patient or surrogate, evaluation of patient's response to treatment, examination of patient, ordering and review of laboratory studies, ordering and review of radiographic studies, ordering and performing treatments and interventions, pulse oximetry, re-evaluation of patient's condition and review of old charts .Cardioversion  Date/Time: 12/12/2022 3:26 PM  Performed by: Cristie Hem, MD Authorized by: Cristie Hem, MD   Consent:    Consent obtained:  Verbal   Consent given by:  Patient   Risks discussed:  Induced arrhythmia and death   Alternatives discussed:  No treatment, alternative treatment and observation Pre-procedure details:    Cardioversion basis:  Emergent   Rhythm:  Supraventricular tachycardia Patient sedated: No Attempt one:    Cardioversion mode attempt one: chemical cardioversion.   Shock outcome:  Conversion to normal sinus rhythm .1-3 Lead EKG Interpretation  Performed by: Cristie Hem, MD Authorized by: Cristie Hem, MD     Interpretation: abnormal     ECG rate:  160   ECG rate assessment: tachycardic     Rhythm: SVT     Ectopy: none     Conduction: normal     (including critical care time)  Medical Decision Making / ED Course   MDM:  46 year old female presenting to the emergency department with tachycardia.  Initial EKG demonstrates SVT.  Consented patient for chemical cardioversion.  Patient received 12 mg adenosine x 1 with conversion to a sinus rhythm confirmed on repeat EKG.  Patient otherwise denies complaints.  Chest x-ray is normal.  No evidence of pneumonia.  She has no underlying symptoms to suggest infectious process such as pneumonia, abdominal infection,  urinary infection.  She has no symptoms to suggest thyroid abnormality.  She reports 1 prior episode in the past.  Will check electrolytes to evaluate need for repletion.  Patient has cardiologist, advised follow-up with cardiologist.  Clinical Course as of 12/12/22 1528  Wed Dec 12, 2022  1524 Signed out to Dr. Vallery Ridge pending electrolyte panel results and likely discharge if normal. [WS]    Clinical Course User Index [WS] Cristie Hem, MD     Additional history obtained: -Additional history obtained from spouse -External records from outside source obtained and reviewed including: Chart review including previous notes, labs, imaging, consultation notes including Cardiology note 06/28/22   Lab Tests: -I ordered, reviewed, and interpreted labs.   The pertinent results include:   Labs Reviewed  CBC - Abnormal; Notable for the following components:      Result Value   MCH 25.5 (*)    All other components within normal limits  HCG, SERUM, QUALITATIVE  BASIC METABOLIC PANEL  MAGNESIUM    Notable for normal CBC, not pregnant  EKG   EKG Interpretation  Date/Time:  Wednesday December 12 2022 15:07:44 EST Ventricular Rate:  90 PR Interval:  193 QRS Duration: 79 QT Interval:  366 QTC Calculation: 448 R Axis:   -18 Text Interpretation: Sinus rhythm Borderline left axis deviation Low voltage, precordial leads Confirmed by Garnette Gunner [phone]) on 12/12/2022 3:19:53 PM         Imaging Studies ordered: I ordered imaging studies including CXR On my interpretation imaging demonstrates no acute process I independently visualized and interpreted imaging. I agree with the radiologist interpretation   Medicines ordered and prescription drug management: Meds ordered this encounter  Medications   adenosine (ADENOCARD) 6 MG/2ML injection 12 mg    -I have reviewed the patients  home medicines and have made adjustments as needed  Cardiac Monitoring: The patient was  maintained on a cardiac monitor.  I personally viewed and interpreted the cardiac monitored which showed an underlying rhythm of: SVT/NSR  Social Determinants of Health:  Diagnosis or treatment significantly limited by social determinants of health: obesity   Reevaluation: After the interventions noted above, I reevaluated the patient and found that they have resolved  Co morbidities that complicate the patient evaluation  Past Medical History:  Diagnosis Date   Diabetes mellitus without complication ([facility])    GERD (gastroesophageal reflux disease)    Heart murmur    Hyperlipidemia    Hypertension    NSTEMI (non-ST elevated myocardial infarction) ([place])    Obstructive sleep apnea    Oxygen deficiency    PONV (postoperative nausea and vomiting)    Sleep apnea       Dispostion: Disposition decision including need for hospitalization was considered, and patient disposition pending at time of sign out.     Final Clinical Impression(s) / ED Diagnoses Final diagnoses:  SVT (supraventricular tachycardia)     This chart was dictated using voice recognition software.  Despite best efforts to proofread,  errors can occur which can change the documentation meaning.    Cristie Hem, MD 12/12/22 [phone]

## 2022-12-12 NOTE — Discharge Instructions (Addendum)
1.  Your potassium was in the low normal range.  If you have heart rhythm problems, potassium in the mid to high normal range is ideal.  Take a low-dose potassium supplement as prescribed.  Make sure your doctor is monitoring this closely.  A potassium that is too high is dangerous but in your case ideally it will be in the mid to high normal range. 2.  Resume your metoprolol at home as per usual. 3.  Return to the emergency department if you have any new worsening or concerning symptoms. 4.  Follow-up with your doctor and your cardiologist as soon as possible.

## 2022-12-12 NOTE — ED Triage Notes (Signed)
Patient here POV from Home.  Endorses Tachycardia that began 30 minutes ago at work while at work.   No Discernable SOB. No CP.  NAD Noted during Triage. A&Ox4. Gcs 15. Ambulatory.

## 2022-12-12 NOTE — ED Provider Notes (Addendum)
SVT converted with adenosine. NSR since. F/U electrolytes. Anticipate d/c if remains NSR. Physical Exam  BP 138/77   Pulse 91   Temp 98.1 F (36.7 C)   Resp 10   Ht '5\' 8"'$  (1.727 m)   Wt 105.7 kg   SpO2 99%   BMI 35.43 kg/m   Physical Exam  Procedures  Procedures  ED Course / MDM   Clinical Course as of 12/12/22 1603  Wed Dec 12, 2022  1524 Signed out to Dr. Vallery Ridge pending electrolyte panel results and likely discharge if normal. [WS]    Clinical Course User Index [WS] Cristie Hem, MD   Medical Decision Making Amount and/or Complexity of Data Reviewed Labs: ordered. Radiology: ordered. ECG/medicine tests: ordered.  Risk Prescription drug management.   16: 03 specimen for history panel hemolyzed twice.  Nurses doing redraw.  Patient is alert and well in appearance.  No distress.  Monitor shows sinus rhythm of 85.  Patient reports she did not take her metoprolol yet today.  Will administer a 25 mg oral dose of metoprolol while waiting for chemistries to return.  There was severe delay in disposition due to recurrent problems with the chemistry and lab.  Upon return of chemistries potassium is 3.8.  Labs are otherwise normal.  With dysrhythmia, will give a dose of potassium with goal of mid to high normal potassium.  Patient's vital signs are normal.  She has remained consistently in sinus rhythm in the 70s.  Blood pressures are normal.  Patient feels well.  Stable for discharge.       Charlesetta Shanks, MD 12/12/22 1604    Charlesetta Shanks, MD 12/12/22 [phone]

## 2022-12-12 NOTE — Telephone Encounter (Signed)
Patient called and stated that she is sitting at a luncheon, and can feel her heart rate up and when she checked it it was 175. She stated that she is a little "unsteady" but not "dizzy". Patient denies chest pain

## 2022-12-21 LAB — HM MAMMOGRAPHY: HM Mammogram: NORMAL (ref 0–4)

## 2022-12-27 ENCOUNTER — Ambulatory Visit: Payer: 59 | Admitting: Cardiology

## 2022-12-27 ENCOUNTER — Encounter: Payer: Self-pay | Admitting: Cardiology

## 2022-12-27 VITALS — BP 118/69 | HR 72 | Resp 16 | Ht 68.0 in | Wt 202.0 lb

## 2022-12-27 DIAGNOSIS — I10 Essential (primary) hypertension: Secondary | ICD-10-CM

## 2022-12-27 DIAGNOSIS — I471 Supraventricular tachycardia, unspecified: Secondary | ICD-10-CM

## 2022-12-27 MED ORDER — METOPROLOL TARTRATE 25 MG PO TABS: 25.0000 mg | ORAL_TABLET | Freq: Two times a day (BID) | ORAL | 2 refills | Status: DC

## 2022-12-27 NOTE — Progress Notes (Signed)
Subjective:   Lauren Carr, female    DOB: January 07, 1976, 47 y.o.   MRN: [id number]   Chief complaint:  Chest pain  47 y/o [place] American female with hypertension, hyperlipidemia, PSVT, family h/o long QT syndrome, NSTEMI with no obstructive CAD on cath (03/2019)  Patient was and was gone emergency room 2 weeks ago with an episode of palpitation that started suddenly during a meeting and persisted for several hours before receiving adenosine for what appeared to be SVT.  Patient has had roughly 2 episodes per year of similar symptoms, but never lasted for this long.  Patient has not had any recurrence since then.  Current Outpatient Medications:    amLODipine (NORVASC) 5 MG tablet, TAKE 1 TABLET BY MOUTH EVERY DAY (Patient taking differently: Take 5 mg by mouth daily.), Disp: 30 tablet, Rfl: 14   atorvastatin (LIPITOR) 80 MG tablet, TAKE 1 TABLET BY MOUTH DAILY AT 6 PM. (Patient taking differently: Take 80 mg by mouth daily.), Disp: 30 tablet, Rfl: 11   cholecalciferol (VITAMIN D3) 25 MCG (1000 UT) tablet, Take 3,000 Units by mouth daily., Disp: , Rfl:    [facility] ASPIRIN LOW DOSE 81 MG tablet, TAKE 1 TABLET BY MOUTH EVERY DAY, Disp: 30 tablet, Rfl: 6   Dexlansoprazole 30 MG capsule DR, TAKE 1 CAPSULE BY MOUTH EVERY DAY (NOT COVERED), Disp: 90 capsule, Rfl: 1   metFORMIN (GLUCOPHAGE) 500 MG tablet, TAKE 1 TABLET BY MOUTH 2 TIMES DAILY WITH A MEAL., Disp: 60 tablet, Rfl: 2   metoprolol tartrate (LOPRESSOR) 25 MG tablet, TAKE HALF A PILL BY MOUTH 2 TIMES DAILY., Disp: 90 tablet, Rfl: 1   Multiple Vitamin (MULTIVITAMIN) tablet, Take 1 tablet by mouth daily. GI multivitamin with iron, Disp: , Rfl:    nitroGLYCERIN (NITROSTAT) 0.4 MG SL tablet, PLACE 1 TABLET UNDER THE TONGUE EVERY 5 MINUTES X 3 DOSES AS NEEDED FOR CHEST PAIN. (Patient taking differently: Place 0.4 mg under the tongue every 5 (five) minutes as needed for chest pain.), Disp: 25 tablet, Rfl: 2   hydrochlorothiazide (HYDRODIURIL)  12.5 MG tablet, TAKE 1 TABLET BY MOUTH EVERY DAY (Patient not taking: Reported on 12/27/2022), Disp: 30 tablet, Rfl: 2  Cardiovascular studies:  EKG 12/27/2022: Sinus rhythm 72 bpm  Low voltage in precordial leads Possible old anteroseptal infarct Nonspecific T-abnormality  EKG 12/12/2022: SVT 182 bpm ST-T changes s/o ischemia  Coronary angiogram 04/20/2019: LM: Normal LAD: Medium caliber. Mid LAD myocardial bridge. Small diagonal branches. No significant stenoses. LCx: Small caliber. Normal RCA: Dominant. No stenoses LVEDP 22 mmHg   Impression: No angiographically evident coronary artery disease   Recommendation: One year DAPT for medical treatment of NSTEMI   Echocardiogram 04/19/2019:  1. Mild hypokinesis of the left ventricular, basal-mid inferolateral wall.  2. The left ventricle has normal systolic function, with an ejection fraction of 60-65%. Left ventricular diastolic function could not be evaluated.  3. The right ventricle has normal systolc function. The cavity was normal.  4. The aortic valve is tricuspid Aortic valve regurgitation is mild by color flow Doppler.  5. The aortic root and ascending aorta are normal in size and structure.    Recent labs:  12/12/2022: Glucose 79, BUN/Cr 12/0.63. EGFR >60. Na/K 138/3.8.  H/H 12/38. MCV 80. Platelets 282  05/2022: HbA1C 6.2% Chol 109, TG 92, HDL 54, LDL 37   07/29/2019: Glucose 95, BUN/Cr 12/0.98. EGFR 81. Na/K 136/4.0.  H/H 11/35. MCV 83. Platelets 256 HbA1C 5.7% Chol 128, TG 98, HDL  54, LDL 54 TSH 1.8 normal      Review of Systems  Cardiovascular:  Positive for palpitations. Negative for chest pain, dyspnea on exertion, leg swelling and syncope.    Vitals:   12/27/22 1445  BP: 118/69  Pulse: 72  Resp: 16  SpO2: 98%      Physical Exam Vitals and nursing note reviewed.  Constitutional:      General: She is not in acute distress. Neck:     Vascular: No JVD.  Cardiovascular:     Rate and Rhythm:  Normal rate and regular rhythm.     Pulses: Intact distal pulses.     Heart sounds: Normal heart sounds. No murmur heard. Pulmonary:     Effort: Pulmonary effort is normal.     Breath sounds: Normal breath sounds. No wheezing or rales.  Musculoskeletal:     Right lower leg: No edema.     Left lower leg: No edema.              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  Discussed vagal maneuvers.  Increase metoprolol tartrate 25 mg twice daily at this time.  Discussed referral to EP for ablation.  Patient is not sure at this time.  In future, could also consider adding diltiazem. Check TSH.  H/o NSTEMI 03/2020. No obstructive CAD on coronary angiogram. Continue Aspirin 81 mg. Continue medical management.  Hypertension: Controlled.  Hyperlipidemia: Continue lipitor 80 mg.  Lipids well controlled.  F/u in 6 weeks   Nigel Mormon, MD Pager: [phone] Office: [phone]

## 2023-01-02 ENCOUNTER — Ambulatory Visit: Payer: 59 | Admitting: Nurse Practitioner

## 2023-01-08 ENCOUNTER — Ambulatory Visit: Payer: 59

## 2023-01-08 DIAGNOSIS — I471 Supraventricular tachycardia, unspecified: Secondary | ICD-10-CM

## 2023-01-21 ENCOUNTER — Ambulatory Visit: Payer: 59 | Admitting: Nurse Practitioner

## 2023-01-21 ENCOUNTER — Encounter: Payer: Self-pay | Admitting: Nurse Practitioner

## 2023-01-21 VITALS — BP 110/70 | HR 65 | Temp 97.8°F | Ht 68.0 in | Wt 191.0 lb

## 2023-01-21 DIAGNOSIS — Z903 Acquired absence of stomach [part of]: Secondary | ICD-10-CM

## 2023-01-21 DIAGNOSIS — Z6829 Body mass index (BMI) 29.0-29.9, adult: Secondary | ICD-10-CM

## 2023-01-21 DIAGNOSIS — I10 Essential (primary) hypertension: Secondary | ICD-10-CM

## 2023-01-21 DIAGNOSIS — E7800 Pure hypercholesterolemia, unspecified: Secondary | ICD-10-CM

## 2023-01-21 DIAGNOSIS — R7309 Other abnormal glucose: Secondary | ICD-10-CM | POA: Diagnosis not present

## 2023-01-21 NOTE — Patient Instructions (Signed)
Hypertension, Adult High blood pressure (hypertension) is when the force of blood pumping through the arteries is too strong. The arteries are the blood vessels that carry blood from the heart throughout the body. Hypertension forces the heart to work harder to pump blood and may cause arteries to become narrow or stiff. Untreated or uncontrolled hypertension can lead to a heart attack, heart failure, a stroke, kidney disease, and other problems. A blood pressure reading consists of a higher number over a lower number. Ideally, your blood pressure should be below 120/80. The first ("top") number is called the systolic pressure. It is a measure of the pressure in your arteries as your heart beats. The second ("bottom") number is called the diastolic pressure. It is a measure of the pressure in your arteries as the heart relaxes. What are the causes? The exact cause of this condition is not known. There are some conditions that result in high blood pressure. What increases the risk? Certain factors may make you more likely to develop high blood pressure. Some of these risk factors are under your control, including: Smoking. Not getting enough exercise or physical activity. Being overweight. Having too much fat, sugar, calories, or salt (sodium) in your diet. Drinking too much alcohol. Other risk factors include: Having a personal history of heart disease, diabetes, high cholesterol, or kidney disease. Stress. Having a family history of high blood pressure and high cholesterol. Having obstructive sleep apnea. Age. The risk increases with age. What are the signs or symptoms? High blood pressure may not cause symptoms. Very high blood pressure (hypertensive crisis) may cause: Headache. Fast or irregular heartbeats (palpitations). Shortness of breath. Nosebleed. Nausea and vomiting. Vision changes. Severe chest pain, dizziness, and seizures. How is this diagnosed? This condition is diagnosed by  measuring your blood pressure while you are seated, with your arm resting on a flat surface, your legs uncrossed, and your feet flat on the floor. The cuff of the blood pressure monitor will be placed directly against the skin of your upper arm at the level of your heart. Blood pressure should be measured at least twice using the same arm. Certain conditions can cause a difference in blood pressure between your right and left arms. If you have a high blood pressure reading during one visit or you have normal blood pressure with other risk factors, you may be asked to: Return on a different day to have your blood pressure checked again. Monitor your blood pressure at home for 1 week or longer. If you are diagnosed with hypertension, you may have other blood or imaging tests to help your health care provider understand your overall risk for other conditions. How is this treated? This condition is treated by making healthy lifestyle changes, such as eating healthy foods, exercising more, and reducing your alcohol intake. You may be referred for counseling on a healthy diet and physical activity. Your health care provider may prescribe medicine if lifestyle changes are not enough to get your blood pressure under control and if: Your systolic blood pressure is above 130. Your diastolic blood pressure is above 80. Your personal target blood pressure may vary depending on your medical conditions, your age, and other factors. Follow these instructions at home: Eating and drinking  Eat a diet that is high in fiber and potassium, and low in sodium, added sugar, and fat. An example of this eating plan is called the DASH diet. DASH stands for Dietary Approaches to Stop Hypertension. To eat this way: Eat   plenty of fresh fruits and vegetables. Try to fill one half of your plate at each meal with fruits and vegetables. Eat whole grains, such as whole-wheat pasta, brown rice, or whole-grain bread. Fill about one  fourth of your plate with whole grains. Eat or drink low-fat dairy products, such as skim milk or low-fat yogurt. Avoid fatty cuts of meat, processed or cured meats, and poultry with skin. Fill about one fourth of your plate with lean proteins, such as fish, chicken without skin, beans, eggs, or tofu. Avoid pre-made and processed foods. These tend to be higher in sodium, added sugar, and fat. Reduce your daily sodium intake. Many people with hypertension should eat less than 1,500 mg of sodium a day. Do not drink alcohol if: Your health care provider tells you not to drink. You are pregnant, may be pregnant, or are planning to become pregnant. If you drink alcohol: Limit how much you have to: 0-1 drink a day for women. 0-2 drinks a day for men. Know how much alcohol is in your drink. In the [place] one drink equals one 12 oz bottle of beer (355 mL), one 5 oz glass of wine (148 mL), or one 1 oz glass of hard liquor (44 mL). Lifestyle  Work with your health care provider to maintain a healthy body weight or to lose weight. Ask what an ideal weight is for you. Get at least 30 minutes of exercise that causes your heart to beat faster (aerobic exercise) most days of the week. Activities may include walking, swimming, or biking. Include exercise to strengthen your muscles (resistance exercise), such as Pilates or lifting weights, as part of your weekly exercise routine. Try to do these types of exercises for 30 minutes at least 3 days a week. Do not use any products that contain nicotine or tobacco. These products include cigarettes, chewing tobacco, and vaping devices, such as e-cigarettes. If you need help quitting, ask your health care provider. Monitor your blood pressure at home as told by your health care provider. Keep all follow-up visits. This is important. Medicines Take over-the-counter and prescription medicines only as told by your health care provider. Follow directions carefully. Blood  pressure medicines must be taken as prescribed. Do not skip doses of blood pressure medicine. Doing this puts you at risk for problems and can make the medicine less effective. Ask your health care provider about side effects or reactions to medicines that you should watch for. Contact a health care provider if you: Think you are having a reaction to a medicine you are taking. Have headaches that keep coming back (recurring). Feel dizzy. Have swelling in your ankles. Have trouble with your vision. Get help right away if you: Develop a severe headache or confusion. Have unusual weakness or numbness. Feel faint. Have severe pain in your chest or abdomen. Vomit repeatedly. Have trouble breathing. These symptoms may be an emergency. Get help right away. Call 911. Do not wait to see if the symptoms will go away. Do not drive yourself to the hospital. Summary Hypertension is when the force of blood pumping through your arteries is too strong. If this condition is not controlled, it may put you at risk for serious complications. Your personal target blood pressure may vary depending on your medical conditions, your age, and other factors. For most people, a normal blood pressure is less than 120/80. Hypertension is treated with lifestyle changes, medicines, or a combination of both. Lifestyle changes include losing weight, eating a healthy,   low-sodium diet, exercising more, and limiting alcohol. This information is not intended to replace advice given to you by your health care provider. Make sure you discuss any questions you have with your health care provider. Document Revised: 10/17/2021 Document Reviewed: 10/17/2021 Elsevier Patient Education  2023 Elsevier Inc.  

## 2023-01-21 NOTE — Progress Notes (Signed)
I,Lauren Carr,acting as a Education administrator for [employer], FNP.,have documented all relevant documentation on the behalf of Lauren Brine, FNP,as directed by  Lauren Brine, FNP while in the presence of Lauren Carr, [place].  Subjective:     Patient ID: Lauren Carr , female    DOB: November 09, 1976 , 47 y.o.   MRN: [id number]   Chief Complaint  Patient presents with   Hypertension    HPI  Pt here today for bpc. She seen Dr. Earnie Larsson earlier this month and was increased on her metoprolol. She is due to see him February and will discuss EP lab/vs medication at this time  Wt Readings from Last 3 Encounters: 01/21/23 : 191 lb (86.6 kg) 12/27/22 : 202 lb (91.6 kg) 12/12/22 : 233 lb 0.4 oz (105.7 kg)  She had the gastric sleeve October 18th at [facility]. She has lost 30 lbs. She continues on metformin. She is due to see the nutritionist. She had been exercising on treadmill 15 minutes and more weight training prior to her being sick with pneumonia. She is doing well with her diet, decaf coffee with protein shake and sugar free creamer. Zero sugar juice, powerade and gatorade (has been dealing with dehydration). She has attempted to eat fried foods which was not good for her. She is eating more salads. She can not eat potatoes or rice. She is no longer taking the [facility]. She stopped in September.   Continues to have a cough from pneumonia and a little congested.   She had pneumonia a couple weeks ago. She was treated with an inhaler and an antibiotic completed on Friday.   Hypertension This is a chronic problem. The current episode started more than 1 year ago. The problem is controlled. Pertinent negatives include no anxiety. There are no associated agents to hypertension. Risk factors for coronary artery disease include obesity and sedentary lifestyle. Past treatments include calcium channel blockers and beta blockers. There are no compliance problems.  There is no history of angina. There is no  history of chronic renal disease.  Gastroesophageal Reflux She reports no abdominal pain, no choking or no coughing. Risk factors include obesity. She has tried a diet change for the symptoms.     Past Medical History:  Diagnosis Date   Diabetes mellitus without complication (HCC)    GERD (gastroesophageal reflux disease)    Heart murmur    Hyperlipidemia    Hypertension    NSTEMI (non-ST elevated myocardial infarction) (HCC)    Obstructive sleep apnea    Oxygen deficiency    PONV (postoperative nausea and vomiting)    Sleep apnea      Family History  Problem Relation Age of Onset   Colon polyps Mother    Breast cancer Mother        early 35s   Hypertension Mother    Long QT syndrome Mother        Diagnosed in 57s after syncope. Has ICD placed   Cancer - Prostate Father    Hypertension Father    Cancer - Cervical Maternal Grandmother    Diabetes Maternal Grandmother    CAD Maternal Grandfather    CAD Paternal Grandfather    Colon cancer Neg Hx    Esophageal cancer Neg Hx    Rectal cancer Neg Hx    Stomach cancer Neg Hx      Current Outpatient Medications:    amLODipine (NORVASC) 5 MG tablet, TAKE 1 TABLET BY MOUTH EVERY DAY (Patient taking differently: Take  5 mg by mouth daily.), Disp: 30 tablet, Rfl: 14   atorvastatin (LIPITOR) 80 MG tablet, TAKE 1 TABLET BY MOUTH DAILY AT 6 PM. (Patient taking differently: Take 80 mg by mouth daily.), Disp: 30 tablet, Rfl: 11   cholecalciferol (VITAMIN D3) 25 MCG (1000 UT) tablet, Take 3,000 Units by mouth daily., Disp: , Rfl:    [facility] ASPIRIN LOW DOSE 81 MG tablet, TAKE 1 TABLET BY MOUTH EVERY DAY, Disp: 30 tablet, Rfl: 6   Dexlansoprazole 30 MG capsule DR, TAKE 1 CAPSULE BY MOUTH EVERY DAY (NOT COVERED), Disp: 90 capsule, Rfl: 1   hydrochlorothiazide (HYDRODIURIL) 12.5 MG tablet, TAKE 1 TABLET BY MOUTH EVERY DAY (Patient not taking: Reported on 12/27/2022), Disp: 30 tablet, Rfl: 2   metFORMIN (GLUCOPHAGE) 500 MG tablet, TAKE 1 TABLET  BY MOUTH TWICE A DAY WITH FOOD, Disp: 60 tablet, Rfl: 2   metoprolol tartrate (LOPRESSOR) 25 MG tablet, Take 1 tablet (25 mg total) by mouth 2 (two) times daily., Disp: 180 tablet, Rfl: 2   Multiple Vitamin (MULTIVITAMIN) tablet, Take 1 tablet by mouth daily. GI multivitamin with iron, Disp: , Rfl:    nitroGLYCERIN (NITROSTAT) 0.4 MG SL tablet, PLACE 1 TABLET UNDER THE TONGUE EVERY 5 MINUTES X 3 DOSES AS NEEDED FOR CHEST PAIN. (Patient taking differently: Place 0.4 mg under the tongue every 5 (five) minutes as needed for chest pain.), Disp: 25 tablet, Rfl: 2   No Known Allergies   Review of Systems  Constitutional: Negative.   Respiratory: Negative.  Negative for cough and choking.   Cardiovascular: Negative.   Gastrointestinal: Negative.  Negative for abdominal pain.  Neurological: Negative.      Today's Vitals   01/21/23 1048  BP: 110/70  Pulse: 65  Temp: 97.8 F (36.6 C)  TempSrc: Oral  SpO2: 96%  Weight: 191 lb (86.6 kg)  Height: '5\' 8"'$  (1.727 m)   Body mass index is 29.04 kg/m.   Objective:  Physical Exam Vitals reviewed.  Constitutional:      General: She is not in acute distress.    Appearance: Normal appearance.  Cardiovascular:     Rate and Rhythm: Normal rate and regular rhythm.     Pulses: Normal pulses.     Heart sounds: Normal heart sounds. No murmur heard. Pulmonary:     Effort: Pulmonary effort is normal. No respiratory distress.     Breath sounds: Normal breath sounds.  Skin:    General: Skin is warm and dry.     Capillary Refill: Capillary refill takes less than 2 seconds.  Neurological:     General: No focal deficit present.     Mental Status: She is alert and oriented to person, place, and time.     Cranial Nerves: No cranial nerve deficit.     Motor: No weakness.         Assessment And Plan:     1. Essential hypertension Comments: Blood pressure is well controlled, continue current medications  2. Abnormal glucose Comments: Improving  since having gastric sleeve. Glocose with CMP was 85  3. Elevated cholesterol Comments: Cholesterol levels are normal with labs at [place] at weight management  4. BMI 29.0-29.9,adult Comments: She has lost 42 lbs since December. Congratulated on this weight loss  5. H/O gastric sleeve Comments: Done at [facility] in October, no longer on Weight loss medications. Continue f/u with [facility]     Patient was given opportunity to ask questions. Patient verbalized understanding of the plan and  was able to repeat key elements of the plan. All questions were answered to their satisfaction.  Lauren Brine, FNP    I, Lauren Brine, FNP, have reviewed all documentation for this visit. The documentation on 01/21/23 for the exam, diagnosis, procedures, and orders are all accurate and complete.  IF YOU HAVE BEEN REFERRED TO A SPECIALIST, IT MAY TAKE 1-2 WEEKS TO SCHEDULE/PROCESS THE REFERRAL. IF YOU HAVE NOT HEARD FROM US/SPECIALIST IN TWO WEEKS, PLEASE GIVE [place] A CALL AT [phone].   THE PATIENT IS ENCOURAGED TO PRACTICE SOCIAL DISTANCING DUE TO THE COVID-19 PANDEMIC.

## 2023-01-24 ENCOUNTER — Other Ambulatory Visit: Payer: Self-pay | Admitting: Nurse Practitioner

## 2023-01-24 DIAGNOSIS — R7309 Other abnormal glucose: Secondary | ICD-10-CM

## 2023-01-29 ENCOUNTER — Ambulatory Visit: Payer: 59

## 2023-02-21 ENCOUNTER — Ambulatory Visit: Payer: 59 | Admitting: Cardiology

## 2023-03-06 ENCOUNTER — Ambulatory Visit: Payer: 59 | Admitting: Cardiology

## 2023-03-06 ENCOUNTER — Encounter: Payer: Self-pay | Admitting: Cardiology

## 2023-03-06 DIAGNOSIS — I10 Essential (primary) hypertension: Secondary | ICD-10-CM

## 2023-03-06 MED ORDER — METOPROLOL TARTRATE 25 MG PO TABS: 25.0000 mg | ORAL_TABLET | Freq: Two times a day (BID) | ORAL | 2 refills | Status: DC

## 2023-03-06 NOTE — Progress Notes (Signed)
Subjective:   Lauren Carr, female    DOB: September 13, 1976, 47 y.o.   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 Palpitations symptoms have improved since starting metoprolol, only last for up to 30 secs.    Current Outpatient Medications:    amLODipine (NORVASC) 5 MG tablet, TAKE 1 TABLET BY MOUTH EVERY DAY (Patient taking differently: Take 5 mg by mouth daily.), Disp: 30 tablet, Rfl: 14   atorvastatin (LIPITOR) 80 MG tablet, TAKE 1 TABLET BY MOUTH DAILY AT 6 PM. (Patient taking differently: Take 80 mg by mouth daily.), Disp: 30 tablet, Rfl: 11   cholecalciferol (VITAMIN D3) 25 MCG (1000 UT) tablet, Take 3,000 Units by mouth daily., Disp: , Rfl:    [facility] ASPIRIN LOW DOSE 81 MG tablet, TAKE 1 TABLET BY MOUTH EVERY DAY, Disp: 30 tablet, Rfl: 6   Dexlansoprazole 30 MG capsule DR, TAKE 1 CAPSULE BY MOUTH EVERY DAY (NOT COVERED), Disp: 90 capsule, Rfl: 1   hydrochlorothiazide (HYDRODIURIL) 12.5 MG tablet, TAKE 1 TABLET BY MOUTH EVERY DAY (Patient not taking: Reported on 12/27/2022), Disp: 30 tablet, Rfl: 2   metFORMIN (GLUCOPHAGE) 500 MG tablet, TAKE 1 TABLET BY MOUTH TWICE A DAY WITH FOOD, Disp: 60 tablet, Rfl: 2   metoprolol tartrate (LOPRESSOR) 25 MG tablet, Take 1 tablet (25 mg total) by mouth 2 (two) times daily., Disp: 180 tablet, Rfl: 2   Multiple Vitamin (MULTIVITAMIN) tablet, Take 1 tablet by mouth daily. GI multivitamin with iron, Disp: , Rfl:    nitroGLYCERIN (NITROSTAT) 0.4 MG SL tablet, PLACE 1 TABLET UNDER THE TONGUE EVERY 5 MINUTES X 3 DOSES AS NEEDED FOR CHEST PAIN. (Patient taking differently: Place 0.4 mg under the tongue every 5 (five) minutes as needed for chest pain.), Disp: 25 tablet, Rfl: 2  Cardiovascular studies:  EKG 12/27/2022: Sinus rhythm 72 bpm  Low voltage in precordial leads Possible old anteroseptal  infarct Nonspecific T-abnormality  Coronary angiogram 04/20/2019: LM: Normal LAD: Medium caliber. Mid LAD myocardial bridge. Small diagonal branches. No significant stenoses. LCx: Small caliber. Normal RCA: Dominant. No stenoses LVEDP 22 mmHg   Impression: No angiographically evident coronary artery disease   Recommendation: One year DAPT for medical treatment of NSTEMI   Echocardiogram 04/19/2019:  1. Mild hypokinesis of the left ventricular, basal-mid inferolateral wall.  2. The left ventricle has normal systolic function, with an ejection fraction of 60-65%. Left ventricular diastolic function could not be evaluated.  3. The right ventricle has normal systolc function. The cavity was normal.  4. The aortic valve is tricuspid Aortic valve regurgitation is mild by color flow Doppler.  5. The aortic root and ascending aorta are normal in size and structure.    Recent labs:  12/12/2022: Glucose 79, BUN/Cr 12/0.63. EGFR >60. Na/K 138/3.8.  H/H 12/38. MCV 80. Platelets 282  05/2022: HbA1C 6.2% Chol 109, TG 92, HDL 54, LDL 37   07/29/2019: Glucose 95, BUN/Cr 12/0.98. EGFR 81. Na/K 136/4.0.  H/H 11/35. MCV 83. Platelets 256 HbA1C 5.7% Chol 128, TG 98, HDL 54, LDL 54 TSH 1.8 normal      Review of Systems  Cardiovascular:  Positive for palpitations. Negative for chest pain, dyspnea on exertion, leg swelling and syncope.    Vitals:   03/06/23 1136  BP: 131/76  Pulse: 70  Resp: 16  SpO2: 99%  Physical Exam Vitals and nursing note reviewed.  Constitutional:      General: She is not in acute distress. Neck:     Vascular: No JVD.  Cardiovascular:     Rate and Rhythm: Normal rate and regular rhythm.     Pulses: Intact distal pulses.     Heart sounds: Normal heart sounds. No murmur heard. Pulmonary:     Effort: Pulmonary effort is normal.     Breath sounds: Normal breath sounds. No wheezing or rales.  Musculoskeletal:     Right lower leg: No edema.     Left  lower leg: No edema.              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 Symptoms improved on metoprolol tartrate 25 mg bid. Continue the same for now. In future, if worsening symptoms, will refer to EP for ablation.  H/o NSTEMI 03/2020. No obstructive CAD on coronary angiogram. Continue Aspirin 81 mg. Continue medical management.  Hypertension: Controlled.  Hyperlipidemia: Continue lipitor 80 mg.  Lipids well controlled.  F/u in 6 months   Nigel Mormon, MD Pager: [phone] Office: [phone]

## 2023-03-07 ENCOUNTER — Encounter: Payer: 59 | Admitting: Nurse Practitioner

## 2023-04-24 ENCOUNTER — Other Ambulatory Visit: Payer: Self-pay | Admitting: Nurse Practitioner

## 2023-04-25 ENCOUNTER — Other Ambulatory Visit: Payer: Self-pay | Admitting: Nurse Practitioner

## 2023-04-25 DIAGNOSIS — K219 Gastro-esophageal reflux disease without esophagitis: Secondary | ICD-10-CM

## 2023-05-07 ENCOUNTER — Other Ambulatory Visit: Payer: Self-pay | Admitting: Cardiology

## 2023-05-07 DIAGNOSIS — I252 Old myocardial infarction: Secondary | ICD-10-CM

## 2023-05-07 DIAGNOSIS — E782 Mixed hyperlipidemia: Secondary | ICD-10-CM

## 2023-05-09 ENCOUNTER — Other Ambulatory Visit: Payer: Self-pay | Admitting: Nurse Practitioner

## 2023-05-09 ENCOUNTER — Other Ambulatory Visit: Payer: Self-pay | Admitting: Cardiology

## 2023-05-09 DIAGNOSIS — I214 Non-ST elevation (NSTEMI) myocardial infarction: Secondary | ICD-10-CM

## 2023-05-09 DIAGNOSIS — R7309 Other abnormal glucose: Secondary | ICD-10-CM

## 2023-05-10 ENCOUNTER — Other Ambulatory Visit: Payer: Self-pay | Admitting: Cardiology

## 2023-05-10 DIAGNOSIS — R079 Chest pain, unspecified: Secondary | ICD-10-CM

## 2023-08-30 ENCOUNTER — Other Ambulatory Visit: Payer: Self-pay | Admitting: Medical Genetics

## 2023-08-30 DIAGNOSIS — Z006 Encounter for examination for normal comparison and control in clinical research program: Secondary | ICD-10-CM

## 2023-09-06 ENCOUNTER — Ambulatory Visit: Payer: 59 | Admitting: Cardiology

## 2023-10-18 ENCOUNTER — Ambulatory Visit: Payer: 59 | Attending: Cardiology | Admitting: Cardiology

## 2023-10-18 ENCOUNTER — Encounter: Payer: Self-pay | Admitting: Cardiology

## 2023-10-18 VITALS — BP 132/78 | HR 68 | Resp 16 | Ht 68.0 in | Wt 188.0 lb

## 2023-10-18 DIAGNOSIS — I214 Non-ST elevation (NSTEMI) myocardial infarction: Secondary | ICD-10-CM | POA: Diagnosis not present

## 2023-10-18 DIAGNOSIS — R079 Chest pain, unspecified: Secondary | ICD-10-CM

## 2023-10-18 DIAGNOSIS — E782 Mixed hyperlipidemia: Secondary | ICD-10-CM

## 2023-10-18 DIAGNOSIS — I471 Supraventricular tachycardia, unspecified: Secondary | ICD-10-CM

## 2023-10-18 DIAGNOSIS — I10 Essential (primary) hypertension: Secondary | ICD-10-CM

## 2023-10-18 DIAGNOSIS — I252 Old myocardial infarction: Secondary | ICD-10-CM | POA: Diagnosis not present

## 2023-10-18 MED ORDER — DILTIAZEM HCL 30 MG PO TABS: 30.0000 mg | ORAL_TABLET | Freq: Three times a day (TID) | ORAL | 6 refills | Status: AC | PRN

## 2023-10-18 MED ORDER — ATORVASTATIN CALCIUM 80 MG PO TABS: 80.0000 mg | ORAL_TABLET | Freq: Every day | ORAL | 3 refills | Status: DC

## 2023-10-18 MED ORDER — AMLODIPINE BESYLATE 5 MG PO TABS: 5.0000 mg | ORAL_TABLET | Freq: Every day | ORAL | 3 refills | Status: DC

## 2023-10-18 MED ORDER — ASPIRIN 81 MG PO TBEC: 81.0000 mg | DELAYED_RELEASE_TABLET | Freq: Every day | ORAL | 2 refills | Status: DC

## 2023-10-18 MED ORDER — METOPROLOL TARTRATE 25 MG PO TABS: 25.0000 mg | ORAL_TABLET | Freq: Two times a day (BID) | ORAL | 3 refills | Status: DC

## 2023-10-18 MED ORDER — NITROGLYCERIN 0.4 MG SL SUBL: 0.4000 mg | SUBLINGUAL_TABLET | SUBLINGUAL | 6 refills | Status: AC | PRN

## 2023-10-18 NOTE — Patient Instructions (Signed)
Medication Instructions:   START TAKING DILTIAZEM (CARDIZEM) 30 MG BY MOUTH EVERY 8 HOURS AS NEEDED FOR PALPITATIONS/SVT  *If you need a refill on your cardiac medications before your next appointment, please call your pharmacy*   Lab Work:  TODAY--LIPIDS  If you have labs (blood work) drawn today and your tests are completely normal, you will receive your results only by: MyChart Message (if you have MyChart) OR A paper copy in the mail If you have any lab test that is abnormal or we need to change your treatment, we will call you to review the results.     Follow-Up: At [facility], you and your health needs are our priority.  As part of our continuing mission to provide you with exceptional heart care, we have created designated Provider Care Teams.  These Care Teams include your primary Cardiologist (physician) and Advanced Practice Providers (APPs -  Physician Assistants and Nurse Practitioners) who all work together to provide you with the care you need, when you need it.  We recommend signing up for the patient portal called "MyChart".  Sign up information is provided on this After Visit Summary.  MyChart is used to connect with patients for Virtual Visits (Telemedicine).  Patients are able to view lab/test results, encounter notes, upcoming appointments, etc.  Non-urgent messages can be sent to your provider as well.   To learn more about what you can do with MyChart, go to [email or web address].    Your next appointment:   1 year(s)  Provider:   DR. Rosemary Holms

## 2023-10-18 NOTE — Progress Notes (Signed)
Cardiology Office Note:  .   Date:  10/18/2023  ID:  Lauren Carr, DOB August 29, 1976, MRN [id number] PCP: Arnette Felts, FNP  [facility] Providers Cardiologist:  Truett Mainland, MD PCP: Arnette Felts, FNP  Chief Complaint  Patient presents with   Essential hypertension   PSVT (paroxysmal supraventricular tachycardia)   Follow-up    6 months      History of Present Illness: .    Lauren Carr is a 47 y.o. female with hypertension, hyperlipidemia, PSVT, family h/o long QT syndrome, MINOCA (03/2019)  Patient has had episodes of palpitations roughly once a month.  They usually last for about 10 minutes, longest episode lasted for up to 2 hours.  However, her highest heart rate during these episodes has only been 116 bpm.  Otherwise, patient is doing well, denies any chest pain, shortness of breath symptoms.  She has not had any recent repeat panel checked.  Vitals:   10/18/23 1518  BP: 132/78  Pulse: 68  Resp: 16  SpO2: 98%     ROS:  Review of Systems  Cardiovascular:  Positive for palpitations. Negative for chest pain, dyspnea on exertion, leg swelling and syncope.     Studies Reviewed: Marland Kitchen       EKG 10/18/2023: Normal sinus rhythm Left axis deviation Septal infarct , age undetermined When compared with ECG of 12-Dec-2022 15:07, No significant change since      Physical Exam:   Physical Exam Vitals and nursing note reviewed.  Constitutional:      General: She is not in acute distress. Neck:     Vascular: No JVD.  Cardiovascular:     Rate and Rhythm: Normal rate and regular rhythm.     Heart sounds: Normal heart sounds. No murmur heard. Pulmonary:     Effort: Pulmonary effort is normal.     Breath sounds: Normal breath sounds. No wheezing or rales.  Musculoskeletal:     Right lower leg: No edema.     Left lower leg: No edema.      VISIT DIAGNOSES:   ICD-10-CM   1. Essential hypertension  I10     2. PSVT (paroxysmal supraventricular  tachycardia) (HCC)  I47.10 EKG 12-Lead       ASSESSMENT AND PLAN: .    Lauren Carr is a 47 y.o. female with hypertension, hyperlipidemia, PSVT, family h/o long QT syndrome, MINOCA (03/2019)   PSVT: Episodes roughly once a month. Frequency and severity still manageable as per the patient. Discussed vagal maneuvers again. Continue metoprolol tartrate 25 mg bid. Added diltiazem 30 mg every 8 hours as needed. In future, if worsening symptoms, will refer to EP for ablation.   H/o NSTEMI 03/2020. No obstructive CAD on coronary angiogram. Continue Aspirin 81 mg. Continue medical management.   Hypertension: Controlled.   Hyperlipidemia: Continue lipitor 80 mg. Check lipid panel today.     Meds ordered this encounter  Medications   nitroGLYCERIN (NITROSTAT) 0.4 MG SL tablet    Sig: Place 1 tablet (0.4 mg total) under the tongue every 5 (five) minutes as needed for chest pain.    Dispense:  25 tablet    Refill:  6   metoprolol tartrate (LOPRESSOR) 25 MG tablet    Sig: Take 1 tablet (25 mg total) by mouth 2 (two) times daily.    Dispense:  180 tablet    Refill:  3   atorvastatin (LIPITOR) 80 MG tablet    Sig: Take 1 tablet (80 mg total) by  mouth daily.    Dispense:  90 tablet    Refill:  3   aspirin EC ([facility] ASPIRIN LOW STRENGTH) 81 MG tablet    Sig: Take 1 tablet (81 mg total) by mouth daily.    Dispense:  90 tablet    Refill:  2   amLODipine (NORVASC) 5 MG tablet    Sig: Take 1 tablet (5 mg total) by mouth daily.    Dispense:  90 tablet    Refill:  3   diltiazem (CARDIZEM) 30 MG tablet    Sig: Take 1 tablet (30 mg total) by mouth every 8 (eight) hours as needed (palpitations/SVT).    Dispense:  30 tablet    Refill:  6     F/u in 1 year  Signed, Elder Negus, MD

## 2023-10-19 LAB — LIPID PANEL
Chol/HDL Ratio: 2.4 ratio (ref 0.0–4.4)
Cholesterol, Total: 199 mg/dL (ref 100–199)
HDL: 83 mg/dL (ref >39)
LDL Chol Calc (NIH): 88 mg/dL (ref 0–99)
Triglycerides: 165 mg/dL — ABNORMAL HIGH (ref 0–149)
VLDL Cholesterol Cal: 28 mg/dL (ref 5–40)

## 2023-11-14 ENCOUNTER — Other Ambulatory Visit (HOSPITAL_COMMUNITY): Payer: 59 | Attending: Medical Genetics

## 2024-04-17 ENCOUNTER — Other Ambulatory Visit: Payer: Self-pay | Admitting: Family Medicine

## 2024-04-17 DIAGNOSIS — Z1231 Encounter for screening mammogram for malignant neoplasm of breast: Secondary | ICD-10-CM

## 2024-05-07 ENCOUNTER — Ambulatory Visit
Admission: RE | Admit: 2024-05-07 | Discharge: 2024-05-07 | Disposition: A | Discharge status: Home / Self Care | Source: Ambulatory Visit | Attending: Family Medicine | Admitting: Family Medicine

## 2024-05-07 DIAGNOSIS — Z1231 Encounter for screening mammogram for malignant neoplasm of breast: Secondary | ICD-10-CM

## 2024-05-13 ENCOUNTER — Other Ambulatory Visit (HOSPITAL_COMMUNITY): Payer: Self-pay

## 2024-05-14 ENCOUNTER — Other Ambulatory Visit (HOSPITAL_COMMUNITY): Payer: Self-pay

## 2024-05-19 ENCOUNTER — Other Ambulatory Visit (HOSPITAL_COMMUNITY): Payer: Self-pay

## 2024-05-22 ENCOUNTER — Other Ambulatory Visit (HOSPITAL_COMMUNITY): Payer: Self-pay

## 2024-05-26 ENCOUNTER — Other Ambulatory Visit (HOSPITAL_COMMUNITY): Payer: Self-pay

## 2024-10-13 ENCOUNTER — Other Ambulatory Visit: Payer: Self-pay | Admitting: Medical Genetics

## 2024-10-13 DIAGNOSIS — Z006 Encounter for examination for normal comparison and control in clinical research program: Secondary | ICD-10-CM

## 2024-11-10 ENCOUNTER — Telehealth: Payer: Self-pay | Admitting: Cardiology

## 2024-11-10 DIAGNOSIS — E782 Mixed hyperlipidemia: Secondary | ICD-10-CM

## 2024-11-10 DIAGNOSIS — I10 Essential (primary) hypertension: Secondary | ICD-10-CM

## 2024-11-10 DIAGNOSIS — I214 Non-ST elevation (NSTEMI) myocardial infarction: Secondary | ICD-10-CM

## 2024-11-10 DIAGNOSIS — R079 Chest pain, unspecified: Secondary | ICD-10-CM

## 2024-11-10 DIAGNOSIS — I252 Old myocardial infarction: Secondary | ICD-10-CM

## 2024-11-10 MED ORDER — AMLODIPINE BESYLATE 5 MG PO TABS: 5.0000 mg | ORAL_TABLET | Freq: Every day | ORAL | 0 refills | Status: AC

## 2024-11-10 MED ORDER — METOPROLOL TARTRATE 25 MG PO TABS: 25.0000 mg | ORAL_TABLET | Freq: Two times a day (BID) | ORAL | 0 refills | Status: AC

## 2024-11-10 MED ORDER — ATORVASTATIN CALCIUM 80 MG PO TABS: 80.0000 mg | ORAL_TABLET | Freq: Every day | ORAL | 0 refills | Status: AC

## 2024-11-10 MED ORDER — ASPIRIN 81 MG PO TBEC: 81.0000 mg | DELAYED_RELEASE_TABLET | Freq: Every day | ORAL | 0 refills | Status: AC

## 2024-11-10 NOTE — Telephone Encounter (Signed)
 Pt's medications were sent to pt's pharmacy as requested. Confirmation received.

## 2024-11-10 NOTE — Telephone Encounter (Signed)
*  STAT* If patient is at the pharmacy, call can be transferred to refill team.   1. Which medications need to be refilled? (please list name of each medication and dose if known)    metoprolol  tartrate (LOPRESSOR ) 25 MG tablet Take 1 tablet (25 mg     atorvastatin  (LIPITOR ) 80 MG tablet  aspirin  EC ([facility] ASPIRIN  LOW STRENGTH) 81 MG tablet  amLODipine  (NORVASC ) 5 MG tablet   4. Which pharmacy/location (including street and city if local pharmacy) is medication to be sent to?  [id number] - [place], [place] - [address] AT [address] Phone: [phone]  Fax: [phone]       5. Do they need a 30 day or 90 day supply? 90    Sch 1/20, states she is completely out

## 2024-12-02 LAB — GENECONNECT MOLECULAR SCREEN: Genetic Analysis Overall Interpretation: NEGATIVE

## 2025-01-12 ENCOUNTER — Ambulatory Visit: Attending: Cardiology | Admitting: Cardiology

## 2025-01-12 NOTE — Progress Notes (Unsigned)
" °  Cardiology Office Note:  .   Date:  01/12/2025  ID:  Lauren Carr, DOB 04-Feb-1976, MRN [id number] PCP: Aileen Gravely, MD (Inactive)  [facility] Providers Cardiologist:  Newman Lawrence, MD PCP: Aileen Gravely, MD (Inactive)  No chief complaint on file.     History of Present Illness: .    Lauren Carr is a 49 y.o. female with hypertension, hyperlipidemia, PSVT, family h/o long QT syndrome, MINOCA (03/2019)  *** Patient has had episodes of palpitations roughly once a month.  They usually last for about 10 minutes, longest episode lasted for up to 2 hours.  However, her highest heart rate during these episodes has only been 116 bpm.  Otherwise, patient is doing well, denies any chest pain, shortness of breath symptoms.  She has not had any recent repeat panel checked.  There were no vitals filed for this visit.    ROS:  Review of Systems  Cardiovascular:  Positive for palpitations. Negative for chest pain, dyspnea on exertion, leg swelling and syncope.     Studies Reviewed: SABRA       EKG 10/18/2023: Normal sinus rhythm Left axis deviation Septal infarct , age undetermined When compared with ECG of 12-Dec-2022 15:07, No significant change since      Physical Exam:   Physical Exam Vitals and nursing note reviewed.  Constitutional:      General: She is not in acute distress. Neck:     Vascular: No JVD.  Cardiovascular:     Rate and Rhythm: Normal rate and regular rhythm.     Heart sounds: Normal heart sounds. No murmur heard. Pulmonary:     Effort: Pulmonary effort is normal.     Breath sounds: Normal breath sounds. No wheezing or rales.  Musculoskeletal:     Right lower leg: No edema.     Left lower leg: No edema.      VISIT DIAGNOSES: No diagnosis found.    ASSESSMENT AND PLAN: .    Lauren Carr is a 49 y.o. female with hypertension, hyperlipidemia, PSVT, family h/o long QT syndrome, MINOCA (03/2019)   *** PSVT: Episodes  roughly once a month. Frequency and severity still manageable as per the patient. Discussed vagal maneuvers again. Continue metoprolol  tartrate 25 mg bid. Added diltiazem  30 mg every 8 hours as needed. In future, if worsening symptoms, will refer to EP for ablation.   *** H/o NSTEMI 03/2020. No obstructive CAD on coronary angiogram. Continue Aspirin  81 mg. Continue medical management.   *** Hypertension: Controlled.   *** Hyperlipidemia: Continue lipitor  80 mg. Check lipid panel today.     No orders of the defined types were placed in this encounter.    F/u in 1 year  Signed, Newman JINNY Lawrence, MD  "
